# Patient Record
Sex: Male | Born: 1940 | Race: White | Hispanic: No | Marital: Married | Smoking: Current every day smoker
Health system: Southern US, Community
[De-identification: ages and names within clinical notes are randomized; demographics above are authoritative.]

## PROBLEM LIST (undated history)

## (undated) DIAGNOSIS — J302 Other seasonal allergic rhinitis: Secondary | ICD-10-CM

## (undated) DIAGNOSIS — E785 Hyperlipidemia, unspecified: Secondary | ICD-10-CM

## (undated) DIAGNOSIS — J449 Chronic obstructive pulmonary disease, unspecified: Secondary | ICD-10-CM

## (undated) DIAGNOSIS — N400 Enlarged prostate without lower urinary tract symptoms: Secondary | ICD-10-CM

## (undated) DIAGNOSIS — M1990 Unspecified osteoarthritis, unspecified site: Secondary | ICD-10-CM

## (undated) HISTORY — DX: Chronic obstructive pulmonary disease, unspecified: J44.9

## (undated) HISTORY — PX: CHOLECYSTECTOMY: SHX55

## (undated) HISTORY — DX: Benign prostatic hyperplasia without lower urinary tract symptoms: N40.0

## (undated) HISTORY — PX: KNEE ARTHROSCOPY: SUR90

## (undated) HISTORY — DX: Other seasonal allergic rhinitis: J30.2

## (undated) HISTORY — DX: Unspecified osteoarthritis, unspecified site: M19.90

## (undated) HISTORY — DX: Hyperlipidemia, unspecified: E78.5

---

## 2001-06-15 ENCOUNTER — Encounter: Payer: Self-pay | Admitting: Surgery

## 2001-06-19 ENCOUNTER — Encounter: Payer: Self-pay | Admitting: Surgery

## 2001-06-19 ENCOUNTER — Ambulatory Visit (HOSPITAL_COMMUNITY): Admission: RE | Admit: 2001-06-19 | Discharge: 2001-06-20 | Payer: Self-pay | Admitting: Surgery

## 2011-12-27 ENCOUNTER — Encounter: Payer: Self-pay | Admitting: Internal Medicine

## 2011-12-27 ENCOUNTER — Ambulatory Visit (INDEPENDENT_AMBULATORY_CARE_PROVIDER_SITE_OTHER): Payer: Medicare Other | Admitting: Internal Medicine

## 2011-12-27 ENCOUNTER — Ambulatory Visit (INDEPENDENT_AMBULATORY_CARE_PROVIDER_SITE_OTHER)
Admission: RE | Admit: 2011-12-27 | Discharge: 2011-12-27 | Disposition: A | Discharge status: Home / Self Care | Payer: Medicare Other | Source: Ambulatory Visit | Attending: Internal Medicine | Admitting: Internal Medicine

## 2011-12-27 VITALS — BP 134/74 | HR 59 | Temp 97.7°F | Ht 70.0 in | Wt 182.0 lb

## 2011-12-27 DIAGNOSIS — J449 Chronic obstructive pulmonary disease, unspecified: Secondary | ICD-10-CM

## 2011-12-27 DIAGNOSIS — F17200 Nicotine dependence, unspecified, uncomplicated: Secondary | ICD-10-CM

## 2011-12-27 DIAGNOSIS — F17210 Nicotine dependence, cigarettes, uncomplicated: Secondary | ICD-10-CM | POA: Insufficient documentation

## 2011-12-27 MED ORDER — BUDESONIDE-FORMOTEROL FUMARATE 160-4.5 MCG/ACT IN AERO: INHALATION_SPRAY | RESPIRATORY_TRACT | Status: DC

## 2011-12-27 NOTE — Patient Instructions (Addendum)
The key is to stop smoking completely before smoking completely stops you!   Symbicort 160 Take 2 puffs first thing in am and then another 2 puffs about 12 hours later - blow out thru nose   use combivent only as needed  Please remember to go to the lab  department downstairs for your tests - we will call you with the results when they are available.     Please schedule a follow up office visit in 6 weeks, call sooner if needed with PFT's

## 2011-12-27 NOTE — Progress Notes (Signed)
  Subjective:    Patient ID: Ernest Singleton, male    DOB: [birth year]   MRN: [id number]  HPI  71 yowm active smoker no resp problems until around age 71 with nasal congestion then breathing problems onset around age 71 or so and gradually worse so referred 12/27/2011 to pulmonary clinic by Dr Charm Barges   12/27/2011 1st pulmonary ov/ Ilena Dieckman cc doe assoc with congested cough year round for sev years esp with hot humid weather, mucus is white usually occ yellow esp in am and again after supper. On advair x 4-5 year and combivent min improved. Nose is stuffy too.  Still not all that limited from desired activities from doe. No overt hb, no really purulent sputum or h/o hemoptysis or tendency to aecopd  Sleeping ok without nocturnal  or early am exacerbation  of respiratory  c/o's or need for noct saba. Also denies any obvious fluctuation of symptoms with weather or environmental changes or other aggravating or alleviating factors except as outlined above      Review of Systems  Constitutional: Negative for fever, chills, activity change, appetite change and unexpected weight change.  HENT: Positive for congestion and sneezing. Negative for sore throat, rhinorrhea, trouble swallowing, dental problem, voice change and postnasal drip.   Eyes: Negative for visual disturbance.  Respiratory: Positive for cough and shortness of breath. Negative for choking.   Cardiovascular: Negative for chest pain and leg swelling.  Gastrointestinal: Negative for nausea, vomiting and abdominal pain.  Genitourinary: Negative for difficulty urinating.  Musculoskeletal: Positive for arthralgias.  Skin: Negative for rash.  Psychiatric/Behavioral: Negative for behavioral problems and confusion.       Objective:   Physical Exam Wt Readings from Last 3 Encounters:  12/27/11 182 lb (82.555 kg)    Pleasant amb wm nad Edentulous HEENT mild turbinate edema.  Oropharynx no thrush or excess pnd or cobblestoning.  No JVD or  cervical adenopathy. Mild accessory muscle hypertrophy. Trachea midline, nl thryroid. Chest was hyperinflated by percussion with diminished breath sounds and moderate increased exp time without wheeze. Hoover sign positive at mid inspiration. Regular rate and rhythm without murmur gallop or rub or increase P2 or edema.  Abd: no hsm, nl excursion. Ext warm without cyanosis or clubbing.    CXR  12/27/2011 :Hyperinflation configuration consistent with element of COPD. Central peribronchial thickening. This may be associated with bronchitis, asthma, and reactive airway disease. No consolidation or pleural effusion is evident       Assessment & Plan:

## 2011-12-27 NOTE — Assessment & Plan Note (Addendum)
Moderate clinically and characterized more by bronchitis or asthma/ emphysema clinically.  Since cough is really his primary complaint and advair not helping, rec trial of symbicort 160 2bid then return for pft's  The proper method of use, as well as anticipated side effects, of a metered-dose inhaler are discussed and demonstrated to the patient. Improved effectiveness after extensive coaching during this visit to a level of approximately  75%

## 2011-12-27 NOTE — Assessment & Plan Note (Addendum)
>   3 min discussion  I reviewed the Flethcher curve with patient that basically indicates  if you quit smoking when your best day FEV1 is still well preserved (which his appears to be) it is highly unlikely you will progress to severe disease and informed the patient there was no medication on the market that has proven to change the curve or the likelihood of progression.  Therefore stopping smoking and maintaining abstinence is the most important aspect of care, not choice of inhalers or for that matter, doctors.   

## 2011-12-27 NOTE — Progress Notes (Signed)
Quick Note:  Attempt to call patient home and cell numbers, no answer. Unable to leave message on machine. Will ATCB ______

## 2011-12-28 NOTE — Progress Notes (Signed)
Quick Note:  Spoke with pt and notified of results per Dr. Wert. Pt verbalized understanding and denied any questions.  ______ 

## 2012-02-09 ENCOUNTER — Encounter: Payer: Self-pay | Admitting: Internal Medicine

## 2012-02-09 ENCOUNTER — Ambulatory Visit (INDEPENDENT_AMBULATORY_CARE_PROVIDER_SITE_OTHER): Payer: Medicare Other | Admitting: Internal Medicine

## 2012-02-09 VITALS — BP 110/62 | HR 52 | Temp 97.5°F | Ht 72.0 in | Wt 181.0 lb

## 2012-02-09 DIAGNOSIS — J449 Chronic obstructive pulmonary disease, unspecified: Secondary | ICD-10-CM

## 2012-02-09 DIAGNOSIS — F17200 Nicotine dependence, unspecified, uncomplicated: Secondary | ICD-10-CM

## 2012-02-09 DIAGNOSIS — J310 Chronic rhinitis: Secondary | ICD-10-CM

## 2012-02-09 LAB — PULMONARY FUNCTION TEST

## 2012-02-09 MED ORDER — IPRATROPIUM-ALBUTEROL 20-100 MCG/ACT IN AERS: 1.0000 | INHALATION_SPRAY | Freq: Four times a day (QID) | RESPIRATORY_TRACT | Status: DC

## 2012-02-09 MED ORDER — AZELASTINE-FLUTICASONE 137-50 MCG/ACT NA SUSP: 2.0000 | Freq: Two times a day (BID) | NASAL | Status: DC

## 2012-02-09 NOTE — Patient Instructions (Signed)
The key is to stop smoking completely before smoking completely stops you - it's not too late  Try dymista twice daily to see if helps your nasal congestion  When you refill the combivent the dose is one puff every 6 hours if needed   Please schedule a follow up visit in 3 months but call sooner if needed

## 2012-02-09 NOTE — Assessment & Plan Note (Signed)
Features of allergic and vasomotor rhinitis by hx   Trial of dymista 02/09/2012 >>>

## 2012-02-09 NOTE — Progress Notes (Signed)
PFT done today. 

## 2012-02-09 NOTE — Assessment & Plan Note (Signed)

## 2012-02-09 NOTE — Assessment & Plan Note (Signed)
-   HFA 75% 12/27/2011   - PFT's 02/09/2012 FEV1 1.51 (51%) ratio 42  And 21% better p saba, dlco 55%   GOLD II/III symptoms better controlled on symbicort and no exacerbations but still smoking - discussed separately    Each maintenance medication was reviewed in detail including most importantly the difference between maintenance and as needed and under what circumstances the prns are to be used.  Please see instructions for details which were reviewed in writing and the patient given a copy.

## 2012-02-09 NOTE — Progress Notes (Signed)
  Subjective:    Patient ID: Ernest Singleton, male    DOB: [birth year]   MRN: [id number]    Brief patient profile:  4  yowm active smoker no resp problems until around age 71 with nasal congestion then breathing problems onset around age 71 or so and gradually worse so referred 12/27/2011 to pulmonary clinic by Dr Charm Barges with GOLD II/III COPD documented 02/09/2012   HPI 12/27/2011 1st pulmonary ov/ Diar Berkel cc doe assoc with congested cough year round for sev years esp with hot humid weather, mucus is white usually occ yellow esp in am and again after supper. On advair x 4-5 year and combivent min improved. Nose is stuffy too.  Still not all that limited from desired activities from doe. No overt hb, no really purulent sputum or h/o hemoptysis or tendency to aecopd rec The key is to stop smoking completely before smoking completely stops you!   Symbicort 160 Take 2 puffs first thing in am and then another 2 puffs about 12 hours later - blow out thru nose  use combivent only as needed  02/09/2012 f/u ov/Dyana Magner still smoking  cc better on symbicort 160 than advair, combivent once a day around lunch if hot and humid or mowing.  No unusual cough, purulent sputum or sinus/hb symptoms on present rx.    Sleeping ok without nocturnal  or early am exacerbation  of respiratory  c/o's or need for noct saba. Also denies any obvious fluctuation of symptoms with weather or environmental changes or other aggravating or alleviating factors except as outlined above    ROS  The following are not active complaints unless bolded sore throat, dysphagia, dental problems, itching, sneezing,  nasal congestion or excess/ purulent secretions, ear ache,   fever, chills, sweats, unintended wt loss, pleuritic or exertional cp, hemoptysis,  orthopnea pnd or leg swelling, presyncope, palpitations, heartburn, abdominal pain, anorexia, nausea, vomiting, diarrhea  or change in bowel or urinary habits, change in stools or urine,  dysuria,hematuria,  rash, arthralgias, visual complaints, headache, numbness weakness or ataxia or problems with walking or coordination,  change in mood/affect or memory.              Objective:   Physical Exam  Wt Readings from Last 3 Encounters:  02/09/12 181 lb (82.101 kg)  12/27/11 182 lb (82.555 kg)     Pleasant amb wm nad Edentulous HEENT mild turbinate edema.  Oropharynx no thrush or excess pnd or cobblestoning.  No JVD or cervical adenopathy. Mild accessory muscle hypertrophy. Trachea midline, nl thryroid. Chest was hyperinflated by percussion with diminished breath sounds and moderate increased exp time without wheeze. Hoover sign positive at mid inspiration. Regular rate and rhythm without murmur gallop or rub or increase P2 or edema.  Abd: no hsm, nl excursion. Ext warm without cyanosis or clubbing.    CXR  12/27/2011 :Hyperinflation configuration consistent with element of COPD. Central peribronchial thickening. This may be associated with bronchitis, asthma, and reactive airway disease. No consolidation or pleural effusion is evident       Assessment & Plan:

## 2012-06-19 ENCOUNTER — Ambulatory Visit: Payer: Medicare Other | Admitting: Internal Medicine

## 2012-09-24 ENCOUNTER — Encounter: Payer: Self-pay | Admitting: Cardiology

## 2012-09-26 ENCOUNTER — Ambulatory Visit (INDEPENDENT_AMBULATORY_CARE_PROVIDER_SITE_OTHER): Payer: Medicare Other | Admitting: Internal Medicine

## 2012-09-26 ENCOUNTER — Encounter: Payer: Self-pay | Admitting: Internal Medicine

## 2012-09-26 VITALS — BP 120/82 | HR 77 | Temp 97.2°F | Ht 69.5 in | Wt 182.0 lb

## 2012-09-26 DIAGNOSIS — F17200 Nicotine dependence, unspecified, uncomplicated: Secondary | ICD-10-CM

## 2012-09-26 DIAGNOSIS — J449 Chronic obstructive pulmonary disease, unspecified: Secondary | ICD-10-CM

## 2012-09-26 MED ORDER — IPRATROPIUM-ALBUTEROL 20-100 MCG/ACT IN AERS: 1.0000 | INHALATION_SPRAY | Freq: Four times a day (QID) | RESPIRATORY_TRACT | Status: DC

## 2012-09-26 MED ORDER — BUDESONIDE-FORMOTEROL FUMARATE 160-4.5 MCG/ACT IN AERO: INHALATION_SPRAY | RESPIRATORY_TRACT | Status: DC

## 2012-09-26 MED ORDER — AZELASTINE-FLUTICASONE 137-50 MCG/ACT NA SUSP: 2.0000 | Freq: Two times a day (BID) | NASAL | Status: DC

## 2012-09-26 NOTE — Assessment & Plan Note (Addendum)
-   HFA 90% 09/26/2012   - PFT's 02/09/2012 FEV1 1.51 (51%) ratio 42  And 21% better p saba, dlco 55%  DDX of  difficult airways managment all start with A and  include Adherence, Ace Inhibitors, Acid Reflux, Active Sinus Disease, Alpha 1 Antitripsin deficiency, Anxiety masquerading as Airways dz,  ABPA,  allergy(esp in young), Aspiration (esp in elderly), Adverse effects of DPI,  Active smokers, plus two Bs  = Bronchiectasis and Beta blocker use..and one C= CHF   Adherence is always the initial "prime suspect" and is a multilayered concern that requires a "trust but verify" approach in every patient - starting with knowing how to use medications, especially inhalers, correctly, keeping up with refills and understanding the fundamental difference between maintenance and prns vs those medications only taken for a very short course and then stopped and not refilled. The proper method of use, as well as anticipated side effects, of a metered-dose inhaler are discussed and demonstrated to the patient. Improved effectiveness after extensive coaching during this visit to a level of approximately  90% so needs to use the symbicort and combivent as precribed and if still not doing well change to tudorza next ov  Active smoking > discussed separately.

## 2012-09-26 NOTE — Progress Notes (Signed)
Subjective:    Patient ID: Ernest Singleton, male    DOB: 1941/02/17   MRN: [id number]    Brief patient profile:  72  yowm active smoker no resp problems until around age 72 with nasal congestion then breathing problems onset around age 1 or so and gradually worse so referred 12/27/2011 to pulmonary clinic by Dr Charm Barges with GOLD II/III COPD documented 02/09/2012   HPI 12/27/2011 1st pulmonary ov/ Ernest Singleton cc doe assoc with congested cough year round for sev years esp with hot humid weather, mucus is white usually occ yellow esp in am and again after supper. On advair x 4-5 year and combivent min improved. Nose is stuffy too.  Still not all that limited from desired activities from doe. No overt hb, no really purulent sputum or h/o hemoptysis or tendency to aecopd rec The key is to stop smoking completely before smoking completely stops you!  Symbicort 160 Take 2 puffs first thing in am and then another 2 puffs about 12 hours later - blow out thru nose use combivent only as needed  02/09/2012 f/u ov/Ernest Singleton still smoking  cc better on symbicort 160 than advair, combivent once a day around lunch if hot and humid or mowing.  rec The key is to stop smoking completely before smoking completely stops you - it's not too late Try dymista twice daily to see if helps your nasal congestion> helped When you refill the combivent the dose is one puff every 6 hours if needed   09/26/2012 f/u ov/Ernest Singleton re copd still smoking Chief Complaint  Patient presents with  . Follow-up    Breathing is slightly worse since Spring season started, relates to pollen in the air. He has occ cough with minimal yellow sputum.    not using symbicort first thing in am as rec, not using much combivent much at all, does feel dysmista helping nasal symptoms vs last spring.   Has trouble "getting his motor running" early each am" s other obvious daytime variabilty or assoc  cp or chest tightness, subjective wheeze overt sinus or hb symptoms.  No unusual exp hx or h/o childhood pna/ asthma or premature birth to his knowledge.    Sleeping ok without nocturnal  or early am exacerbation  of respiratory  c/o's or need for noct saba. Also denies any obvious fluctuation of symptoms with weather or environmental changes or other aggravating or alleviating factors except as outlined above    ROS  The following are not active complaints unless bolded sore throat, dysphagia, dental problems, itching, sneezing,  nasal congestion or excess/ purulent secretions, ear ache,   fever, chills, sweats, unintended wt loss, pleuritic or exertional cp, hemoptysis,  orthopnea pnd or leg swelling, presyncope, palpitations, heartburn, abdominal pain, anorexia, nausea, vomiting, diarrhea  or change in bowel or urinary habits, change in stools or urine, dysuria,hematuria,  rash, arthralgias, visual complaints, headache, numbness weakness or ataxia or problems with walking or coordination,  change in mood/affect or memory.              Objective:   Physical Exam 09/26/2012  182  Wt Readings from Last 3 Encounters:  02/09/12 181 lb (82.101 kg)  12/27/11 182 lb (82.555 kg)     Pleasant amb wm nad Edentulous HEENT mild turbinate edema.  Oropharynx no thrush or excess pnd or cobblestoning.  No JVD or cervical adenopathy. Mild accessory muscle hypertrophy. Trachea midline, nl thryroid. Chest was hyperinflated by percussion with diminished breath sounds and moderate increased exp  time without wheeze. Hoover sign positive at mid inspiration. Regular rate and rhythm without murmur gallop or rub or increase P2 or edema.  Abd: no hsm, nl excursion. Ext warm without cyanosis or clubbing.    CXR  12/27/2011 :Hyperinflation configuration consistent with element of COPD. Central peribronchial thickening. This may be associated with bronchitis, asthma, and reactive airway disease. No consolidation or pleural effusion is evident       Assessment & Plan:

## 2012-09-26 NOTE — Patient Instructions (Addendum)
Symbicort 160 Take 2 puffs first thing in am and then another 2 puffs about 12 hours later.   The key is to stop smoking completely before smoking completely stops you - it's not too late  Try dymista twice daily to see if helps your nasal congestion  When you refill the combivent the dose is one puff every 6 hours if needed   Please schedule a follow up visit in 6 months but call sooner if needed  Late add consider LAMA next ov

## 2012-09-28 ENCOUNTER — Encounter: Payer: Self-pay | Admitting: Cardiology

## 2012-09-28 NOTE — Assessment & Plan Note (Signed)

## 2012-09-29 ENCOUNTER — Other Ambulatory Visit: Payer: Self-pay | Admitting: *Deleted

## 2012-09-29 ENCOUNTER — Ambulatory Visit (INDEPENDENT_AMBULATORY_CARE_PROVIDER_SITE_OTHER): Payer: Medicare Other | Admitting: Cardiology

## 2012-09-29 ENCOUNTER — Encounter: Payer: Self-pay | Admitting: Cardiology

## 2012-09-29 VITALS — BP 142/81 | HR 80 | Ht 69.0 in | Wt 183.4 lb

## 2012-09-29 DIAGNOSIS — Z136 Encounter for screening for cardiovascular disorders: Secondary | ICD-10-CM

## 2012-09-29 DIAGNOSIS — R55 Syncope and collapse: Secondary | ICD-10-CM

## 2012-09-29 DIAGNOSIS — J449 Chronic obstructive pulmonary disease, unspecified: Secondary | ICD-10-CM

## 2012-09-29 MED ORDER — MEDICAL COMPRESSION STOCKINGS MISC: 1.0000 | Status: DC

## 2012-09-29 NOTE — Patient Instructions (Addendum)
Your physician recommends that you schedule a follow-up appointment in: 1 month. Your physician recommends that you continue on your current medications as directed. Please refer to the Current Medication list given to you today. Your physician has recommended that you wear an event monitor. Event monitors are medical devices that record the heart's electrical activity. Doctors most often [place] these monitors to diagnose arrhythmias. Arrhythmias are problems with the speed or rhythm of the heartbeat. The monitor is a small, portable device. You can wear one while you do your normal daily activities. This is usually used to diagnose what is causing palpitations/syncope (passing out). You have been given a prescription for compression hose. Please take this prescription to [facility] or your pharmacy of choice for proper measuring and fitting.

## 2012-09-29 NOTE — Assessment & Plan Note (Signed)
Followed by Dr Wert 

## 2012-09-29 NOTE — Assessment & Plan Note (Signed)
Occurred after a prolonged period of standing, possibly neurocardiogenic in etiology. He is on Flomax which can contribute to orthostatic hypotension, although has been on this medication for a long time. Baseline blood pressure today is elevated, no diagnostic orthostatic change. He reports occasional feelings of lightheadedness, leg tingling, also occasional palpitations. 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 Will also screen with a seven-day monitor to exclude any associated arrhythmias. We'll bring him back in a month to see how he is doing.

## 2012-09-29 NOTE — Progress Notes (Signed)
Clinical Summary Ernest Singleton is a 72 y.o.male referred for cardiology consultation by Dr. Charm Barges. He reports an episode of syncope that occurred after he had been standing for approximately 2 hours during a funeral service. He states that he felt somewhat "funny" prior to this happening, no distinct palpitations, breathlessness, or chest pain. He was attended to and told his blood pressure was "low," also checked by EMS but did not require any specific treatment or further intervention. Since that time he has had no further events. He does state that he occasionally feels somewhat lightheaded, no specific precipitant. Sometimes states he feels like his heart is "fast."  He reports no history of CAD, has had no angina. He does have stable NYHA class II dyspnea with COPD. No history of cardiac arrhythmias or structural heart disease.  Medications reviewed, he states that Flomax is a long-term medication.  ECG today shows sinus rhythm with sinus arrhythmia and motion artifact.  Orthostatics checked today in the office showed decrease in systolic blood pressure from 153 down to 146 from supine to seated position, increased to 150 with standing, but decreased down to 137 with persistent standing. Heart rate did not change significantly.   Allergies  Allergen Reactions  . Requip (Ropinirole Hcl)     Dizziness     Current Outpatient Prescriptions  Medication Sig Dispense Refill  . aspirin 81 MG tablet Take 81 mg by mouth daily.      . Azelastine-Fluticasone (DYMISTA) 137-50 MCG/ACT SUSP Place 2 puffs into the nose 2 (two) times daily.  1 Bottle  11  . budesonide-formoterol (SYMBICORT) 160-4.5 MCG/ACT inhaler Take 2 puffs first thing in am and then another 2 puffs about 12 hours later.  1 Inhaler  12  . CELEBREX 200 MG capsule Take 1 tablet by mouth daily.      . cetirizine (ZYRTEC) 10 MG chewable tablet Chew 10 mg by mouth daily.       . Cholecalciferol (VITAMIN D-3) 1000 UNITS CAPS Take 1,000  Units by mouth daily.      Ernest Singleton (MEDICAL COMPRESSION STOCKINGS) MISC 1 each by Does not apply route as directed.  1 each  0  . Ipratropium-Albuterol (COMBIVENT RESPIMAT) 20-100 MCG/ACT AERS respimat Inhale 1 puff into the lungs every 6 (six) hours.  1 Inhaler  11  . levofloxacin (LEVAQUIN) 500 MG tablet Take 1 tablet by mouth 2 (two) times daily.      . Tamsulosin HCl (FLOMAX) 0.4 MG CAPS Take 0.4 mg by mouth daily.       No current facility-administered medications for this visit.    Past Medical History  Diagnosis Date  . Seasonal allergies   . COPD (chronic obstructive pulmonary disease)     Dr. Sherene Sires  . Osteoarthritis   . BPH (benign prostatic hyperplasia)   . Hyperlipidemia     Past Surgical History  Procedure Laterality Date  . Cholecystectomy    . Knee arthroscopy      Family History  Problem Relation Age of Onset  . Lung cancer Paternal Uncle     ? if smoker or not  . Brain cancer Paternal Uncle   . Heart disease Father     Social History Ernest Singleton reports that he has been smoking Cigarettes.  He has a 54 pack-year smoking history. He has never used smokeless tobacco. Ernest Singleton reports that he does not drink alcohol.  Review of Systems No exertional chest pain. No orthopnea or PND. No  claudication. Occasional tingling in his legs when he stands. Stable appetite. No bleeding problems. Otherwise negative. Physical Examination Filed Vitals:   09/29/12 0851  BP: 142/81  Pulse: 80   Filed Weights   09/29/12 0841  Weight: 183 lb 6.4 oz (83.19 kg)   No acute distress. HEENT: Conjunctiva and lids normal, oropharynx clear. Neck: Supple, no elevated JVP or carotid bruits, no thyromegaly. Lungs: Clear to auscultation, nonlabored breathing at rest. Cardiac: Regular rate and rhythm, no S3 or significant systolic murmur, no pericardial rub. Abdomen: Soft, nontender, bowel sounds present, no bruits. Extremities: No pitting edema, distal pulses  2+. Skin: Warm and dry. Musculoskeletal: No kyphosis. Neuropsychiatric: Alert and oriented x3, affect grossly appropriate.   Problem List and Plan   Syncope Occurred after a prolonged period of standing, possibly neurocardiogenic in etiology. He is on Flomax which can contribute to orthostatic hypotension, although has been on this medication for a long time. Baseline blood pressure today is elevated, no diagnostic orthostatic change. He reports occasional feelings of lightheadedness, leg tingling, also occasional palpitations. 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 Will also screen with a seven-day monitor to exclude any associated arrhythmias. We'll bring him back in a month to see how he is doing.  COPD (chronic obstructive pulmonary disease) Followed by Dr. Sherene Sires.    Jonelle Sidle, M.D., F.A.C.C.

## 2012-11-07 ENCOUNTER — Encounter: Payer: Self-pay | Admitting: Cardiology

## 2012-11-07 ENCOUNTER — Ambulatory Visit (INDEPENDENT_AMBULATORY_CARE_PROVIDER_SITE_OTHER): Payer: Medicare Other | Admitting: Cardiology

## 2012-11-07 VITALS — BP 124/72 | HR 75 | Ht 70.0 in | Wt 180.8 lb

## 2012-11-07 DIAGNOSIS — R55 Syncope and collapse: Secondary | ICD-10-CM

## 2012-11-07 NOTE — Progress Notes (Signed)
   Clinical Summary Mr. Earna Coder SR is a 72 y.o.male last seen in April 2014. He presents for followup today, reports no dizziness or syncope. Does have stable fatigue and shortness of breath (particularly in the heat), no history of COPD. No exertional chest tightness or heaviness however.  Cardiac monitor reviewed finding sinus rhythm with artifact, no sustained arrhythmias or pauses. We reviewed this today.  For now would recommend symptom observation. No further testing at this particular time.  Allergies  Allergen Reactions  . Requip (Ropinirole Hcl)     Dizziness     Current Outpatient Prescriptions  Medication Sig Dispense Refill  . aspirin 81 MG tablet Take 81 mg by mouth daily.      . Azelastine-Fluticasone (DYMISTA) 137-50 MCG/ACT SUSP Place 2 puffs into the nose 2 (two) times daily.  1 Bottle  11  . budesonide-formoterol (SYMBICORT) 160-4.5 MCG/ACT inhaler Take 2 puffs first thing in am and then another 2 puffs about 12 hours later.  1 Inhaler  12  . CELEBREX 200 MG capsule Take 1 tablet by mouth daily.      . Cholecalciferol (VITAMIN D-3) 1000 UNITS CAPS Take 1,000 Units by mouth daily.      . Ipratropium-Albuterol (COMBIVENT) 20-100 MCG/ACT AERS respimat Inhale 1 puff into the lungs every 6 (six) hours as needed.      . Tamsulosin HCl (FLOMAX) 0.4 MG CAPS Take 0.4 mg by mouth daily.      . cetirizine (ZYRTEC) 10 MG chewable tablet Chew 10 mg by mouth as needed.        No current facility-administered medications for this visit.    Past Medical History  Diagnosis Date  . Seasonal allergies   . COPD (chronic obstructive pulmonary disease)     Dr. Sherene Sires  . Osteoarthritis   . BPH (benign prostatic hyperplasia)   . Hyperlipidemia     Social History Mr. Earna Coder SR reports that he has been smoking Cigarettes.  He has a 54 pack-year smoking history. He has never used smokeless tobacco. Mr. Earna Coder SR reports that he does not drink alcohol.  Review of Systems Negative  except as outlined.  Physical Examination Filed Vitals:   11/07/12 1358  BP: 124/72  Pulse: 75   Filed Weights   11/07/12 1358  Weight: 180 lb 12.8 oz (82.01 kg)    No acute distress.  HEENT: Conjunctiva and lids normal, oropharynx clear.  Neck: Supple, no elevated JVP or carotid bruits, no thyromegaly.  Lungs: Clear to auscultation, nonlabored breathing at rest.  Cardiac: Regular rate and rhythm, no S3 or significant systolic murmur, no pericardial rub.  Abdomen: Soft, nontender, bowel sounds present, no bruits.  Extremities: No pitting edema, distal pulses 2+.    Problem List and Plan   Syncope No recurrences, most likely neurocardiogenic based on description of previous event. Cardiac monitor was unrevealing. Observation recommended for now. We have scheduled a followup visit.    Jonelle Sidle, M.D., F.A.C.C.

## 2012-11-07 NOTE — Patient Instructions (Addendum)

## 2012-11-07 NOTE — Assessment & Plan Note (Signed)
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 Cardiac monitor was unrevealing. Observation recommended for now. We have scheduled a followup visit.

## 2013-01-26 ENCOUNTER — Other Ambulatory Visit: Payer: Self-pay | Admitting: *Deleted

## 2013-01-26 MED ORDER — BUDESONIDE-FORMOTEROL FUMARATE 160-4.5 MCG/ACT IN AERO: INHALATION_SPRAY | RESPIRATORY_TRACT | Status: DC

## 2013-03-26 ENCOUNTER — Ambulatory Visit (INDEPENDENT_AMBULATORY_CARE_PROVIDER_SITE_OTHER)
Admission: RE | Admit: 2013-03-26 | Discharge: 2013-03-26 | Disposition: A | Discharge status: Home / Self Care | Payer: Medicare Other | Source: Ambulatory Visit | Attending: Internal Medicine | Admitting: Internal Medicine

## 2013-03-26 ENCOUNTER — Ambulatory Visit (INDEPENDENT_AMBULATORY_CARE_PROVIDER_SITE_OTHER): Payer: Medicare Other | Admitting: Internal Medicine

## 2013-03-26 ENCOUNTER — Encounter: Payer: Self-pay | Admitting: Internal Medicine

## 2013-03-26 VITALS — BP 130/82 | HR 52 | Temp 97.5°F | Ht 69.5 in | Wt 179.6 lb

## 2013-03-26 DIAGNOSIS — J449 Chronic obstructive pulmonary disease, unspecified: Secondary | ICD-10-CM

## 2013-03-26 DIAGNOSIS — Z23 Encounter for immunization: Secondary | ICD-10-CM

## 2013-03-26 DIAGNOSIS — F17200 Nicotine dependence, unspecified, uncomplicated: Secondary | ICD-10-CM

## 2013-03-26 MED ORDER — AMOXICILLIN-POT CLAVULANATE 875-125 MG PO TABS: 1.0000 | ORAL_TABLET | Freq: Two times a day (BID) | ORAL | Status: DC

## 2013-03-26 MED ORDER — ALBUTEROL SULFATE HFA 108 (90 BASE) MCG/ACT IN AERS: 2.0000 | INHALATION_SPRAY | Freq: Four times a day (QID) | RESPIRATORY_TRACT | Status: DC | PRN

## 2013-03-26 MED ORDER — BUDESONIDE-FORMOTEROL FUMARATE 160-4.5 MCG/ACT IN AERO: INHALATION_SPRAY | RESPIRATORY_TRACT | Status: DC

## 2013-03-26 NOTE — Addendum Note (Signed)
Addended by: Gwynneth Aliment A on: 03/26/2013 04:33 PM   Modules accepted: Orders

## 2013-03-26 NOTE — Assessment & Plan Note (Signed)
-   HFA 90% 09/26/2012   - PFT's 02/09/2012 FEV1 1.51 (51%) ratio 42  And 21% better p saba, dlco 55%   Mild flare of CB x 1 month likely due to smoking/ rhinitis > rx augmentin and recheck cxr as still smoking  Smoking discussed separately  The proper method of use, as well as anticipated side effects, of a metered-dose inhaler are discussed and demonstrated to the patient. Improved effectiveness after extensive coaching during this visit to a level of approximately  90%     Each maintenance medication was reviewed in detail including most importantly the difference between maintenance and as needed and under what circumstances the prns are to be used.  Please see instructions for details which were reviewed in writing and the patient given a copy.

## 2013-03-26 NOTE — Patient Instructions (Addendum)
Augmentin 875 mg take one pill twice daily  X 10 days - take at breakfast and supper with large glass of water.  It would help reduce the usual side effects (diarrhea and yeast infections) if you ate cultured yogurt at lunch.    Symbicort Take 2 puffs first thing in am and then another 2 puffs about 12 hours later.   For cough best choice is mucinex dm up to 1200 mg every 12 hours  as needed  The key is to stop smoking completely before smoking completely stops you - this is the most important aspect of your care.    Only use your albuterol(proaire)  as a rescue medication to be used if you can't catch your breath by resting or doing a relaxed purse lip breathing pattern.  - The less you use it, the better it will work when you need it. - Ok to use up to every 4 hours if you must but call for immediate appointment if use goes up over your usual need - Don't leave home without it !!  (think of it like your spare tire for your car)   Please remember to go to the  x-ray department downstairs for your tests - we will call you with the results when they are available.  Please schedule a follow up visit in 6 months but call sooner if needed

## 2013-03-26 NOTE — Progress Notes (Signed)
Subjective:    Patient ID: Ernest Singleton, male    DOB: [birth year]   MRN: [id number]    Brief patient profile:  72  yowm active smoker no resp problems until around age 72 with nasal congestion then breathing problems onset around age 1 or so and gradually worse so referred 12/27/2011 to pulmonary clinic by Dr Charm Barges with GOLD II/III COPD documented 02/09/2012   HPI 12/27/2011 1st pulmonary ov/ Ernest Singleton cc doe assoc with congested cough year round for sev years esp with hot humid weather, mucus is white usually occ yellow esp in am and again after supper. On advair x 4-5 year and combivent min improved. Nose is stuffy too.  Still not all that limited from desired activities from doe. No overt hb, no really purulent sputum or h/o hemoptysis or tendency to aecopd rec The key is to stop smoking completely before smoking completely stops you!  Symbicort 160 Take 2 puffs first thing in am and then another 2 puffs about 12 hours later - blow out thru nose use combivent only as needed  02/09/2012 f/u ov/Ernest Singleton still smoking  cc better on symbicort 160 than advair, combivent once a day around lunch if hot and humid or mowing.  rec The key is to stop smoking completely before smoking completely stops you - it's not too late Try dymista twice daily to see if helps your nasal congestion> helped When you refill the combivent the dose is one puff every 6 hours if needed   03/26/2013 f/u ov/Ernest Singleton re: GOLD II copd/ still smoking  Chief Complaint  Patient presents with  . Follow-up    Breathing is unchanged since last OV.  Having congestion, cough w/ mucus yellowish in color x1 month   saba x done daily "maybe around lunchtime"  or if gets in a real hurry at work, seems to help exert better Cough worse first thing in am p stirring then late in pm Using his symbicort in am first thing but second dose is at hs Has empty proaire, not able to use combivent respimat    Still Has trouble "getting his motor running"  early each am" s other obvious daytime variabilty or assoc  cp or chest tightness, subjective wheeze overt sinus or hb symptoms. No unusual exp hx or h/o childhood pna/ asthma or premature birth to his knowledge.       Sleeping ok without nocturnal  or early am exacerbation  of respiratory  c/o's or need for noct saba. Also denies any obvious fluctuation of symptoms with weather or environmental changes or other aggravating or alleviating factors except as outlined above   Current Medications, Allergies, Complete Past Medical History, Past Surgical History, Family History, and Social History were reviewed in [employer] record.  ROS  The following are not active complaints unless bolded sore throat, dysphagia, dental problems, itching, sneezing,  nasal congestion or excess/ purulent secretions, ear ache,   fever, chills, sweats, unintended wt loss, pleuritic or exertional cp, hemoptysis,  orthopnea pnd or leg swelling, presyncope, palpitations, heartburn, abdominal pain, anorexia, nausea, vomiting, diarrhea  or change in bowel or urinary habits, change in stools or urine, dysuria,hematuria,  rash, arthralgias, visual complaints, headache, numbness weakness or ataxia or problems with walking or coordination,  change in mood/affect or memory.            Objective:   Physical Exam 09/26/2012  182 >  03/26/2013  179  Wt Readings from Last 3 Encounters:  02/09/12  181 lb (82.101 kg)  12/27/11 182 lb (82.555 kg)     Pleasant amb wm nad Edentulous HEENT mild turbinate edema.  Oropharynx no thrush or excess pnd or cobblestoning.  No JVD or cervical adenopathy. Mild accessory muscle hypertrophy. Trachea midline, nl thryroid. Chest was hyperinflated by percussion with diminished breath sounds and moderate increased exp time without wheeze. Hoover sign positive at mid inspiration. Regular rate and rhythm without murmur gallop or rub or increase P2 or edema.  Abd: no hsm, nl  excursion. Ext warm without cyanosis or clubbing.       CXR  03/26/2013   Findings consistent with COPD. No acute abnormality seen.         Assessment & Plan:

## 2013-03-26 NOTE — Assessment & Plan Note (Signed)
>   3 min  I reviewed the Flethcher curve with patient that basically indicates  if you quit smoking when your best day FEV1 is still well preserved (as is the case here)  it is highly unlikely you will progress to severe disease and informed the patient there was no medication on the market that has proven to change the curve or the likelihood of progression.  Therefore stopping smoking and maintaining abstinence are  the most important aspects of care, not choice of inhalers or for that matter, doctors.

## 2013-03-27 ENCOUNTER — Telehealth: Payer: Self-pay | Admitting: Internal Medicine

## 2013-03-27 NOTE — Telephone Encounter (Signed)
Advised pt of CXR results per MW.  Pt verbalized understanding and has no further questions at this time

## 2013-08-24 ENCOUNTER — Other Ambulatory Visit: Payer: Self-pay | Admitting: Internal Medicine

## 2013-11-05 ENCOUNTER — Other Ambulatory Visit: Payer: Self-pay | Admitting: Internal Medicine

## 2013-12-04 ENCOUNTER — Other Ambulatory Visit: Payer: Self-pay | Admitting: Internal Medicine

## 2013-12-17 ENCOUNTER — Ambulatory Visit (INDEPENDENT_AMBULATORY_CARE_PROVIDER_SITE_OTHER): Payer: Medicare Other | Admitting: Internal Medicine

## 2013-12-17 ENCOUNTER — Encounter: Payer: Self-pay | Admitting: Internal Medicine

## 2013-12-17 ENCOUNTER — Encounter (INDEPENDENT_AMBULATORY_CARE_PROVIDER_SITE_OTHER): Payer: Self-pay

## 2013-12-17 VITALS — BP 120/64 | HR 56 | Temp 97.6°F | Ht 70.0 in | Wt 176.0 lb

## 2013-12-17 DIAGNOSIS — J449 Chronic obstructive pulmonary disease, unspecified: Secondary | ICD-10-CM

## 2013-12-17 DIAGNOSIS — F17200 Nicotine dependence, unspecified, uncomplicated: Secondary | ICD-10-CM

## 2013-12-17 NOTE — Assessment & Plan Note (Signed)
I took an extended  opportunity with this patient to outline the consequences of continued cigarette use  in airway disorders based on all the data we have from the multiple national lung health studies (perfomed over decades at millions of dollars in cost)  indicating that smoking cessation, not choice of inhalers or physicians, is the most important aspect of care.   

## 2013-12-17 NOTE — Assessment & Plan Note (Signed)
-   PFT's 02/09/2012 FEV1 1.51 (51%) ratio 42  And 21% better p saba, dlco 55%  DDX of  difficult airways management all start with A and  include Adherence, Ace Inhibitors, Acid Reflux, Active Sinus Disease, Alpha 1 Antitripsin deficiency, Anxiety masquerading as Airways dz,  ABPA,  allergy(esp in young), Aspiration (esp in elderly), Adverse effects of DPI,  Active smokers, plus two Bs  = Bronchiectasis and Beta blocker use..and one C= CHF  Adherence is always the initial "prime suspect" and is a multilayered concern that requires a "trust but verify" approach in every patient - starting with knowing how to use medications, especially inhalers, correctly, keeping up with refills and understanding the fundamental difference between maintenance and prns vs those medications only taken for a very short course and then stopped and not refilled.  The proper method of use, as well as anticipated side effects, of a metered-dose inhaler are discussed and demonstrated to the patient. Improved effectiveness after extensive coaching during this visit to a level of approximately  90%  Active smoking discussed seprately, obviously the biggest concern

## 2013-12-17 NOTE — Progress Notes (Signed)
Subjective:    Patient ID: Ernest Singleton, male    DOB: 1941-06-07   MRN: [id number]    Brief patient profile:  52  yowm active smoker no resp problems until around age 73 with nasal congestion then breathing problems onset around age 48 or so and gradually worse so referred 12/27/2011 to pulmonary clinic by Dr Melina Copa with GOLD II/III COPD documented 02/09/2012   HPI 12/27/2011 1st pulmonary ov/ Ernest Singleton cc doe [facility]oc with congested cough year round for sev years esp with hot humid weather, mucus is white usually occ yellow esp in am and again after supper. On advair x 4-5 year and combivent min improved. Nose is stuffy too.  Still not all that limited from desired activities from doe. No overt hb, no really purulent sputum or h/o hemoptysis or tendency to aecopd rec The key is to stop smoking completely before smoking completely stops you!  Symbicort 160 Take 2 puffs first thing in am and then another 2 puffs about 12 hours later - blow out thru nose use combivent only as needed  02/09/2012 f/u ov/Ernest Singleton still smoking  cc better on symbicort 160 than advair, combivent once a day around lunch if hot and humid or mowing.  rec The key is to stop smoking completely before smoking completely stops you - it's not too late Try dymista twice daily to see if helps your nasal congestion> helped When you refill the combivent the dose is one puff every 6 hours if needed   03/26/2013 f/u ov/Ernest Singleton re: GOLD II copd/ still smoking  Chief Complaint  Patient presents with  . Follow-up    Breathing is unchanged since last OV.  Having congestion, cough w/ mucus yellowish in color x1 month   saba x done daily "maybe around lunchtime"  or if gets in a real hurry at work, seems to help exert better Cough worse first thing in am p stirring then late in pm Using his symbicort in am first thing but second dose is at hs Has empty proaire, not able to use combivent respimat rec Augmentin 875 mg take one pill twice daily  X  10 days - take at breakfast and supper with large glass of water.  It would help reduce the usual side effects (diarrhea and yeast infections) if you ate cultured yogurt at lunch.   Symbicort Take 2 puffs first thing in am and then another 2 puffs about 12 hours later.  For cough best choice is mucinex dm up to 1200 mg every 12 hours  as needed The key is to stop smoking completely before smoking completely stops you - this is the most important aspect of your care. Only use your albuterol(proaire) every 4 hours if needed   12/17/2013 f/u ov/Ernest Singleton re: copd GOLD II/ still smoking  Chief Complaint  Patient presents with  . Follow-up    Pt states his breathing seems to be slightly worse for the past several months. Humid weather makes breathing worse. He is using rescue inhaler once per day around noon usually.    Worse in heat- ok walking flat  Some congestion with thick white mucus esp in am, not using saba avg saba maybe once daily at most, never at night or first thing in am    No obvious day to day or daytime variabilty or assoc  cp or chest tightness, subjective wheeze overt sinus or hb symptoms. No unusual exp hx or h/o childhood pna/ asthma or knowledge of premature birth.  Sleeping ok without nocturnal  or early am exacerbation  of respiratory  c/o's or need for noct saba. Also denies any obvious fluctuation of symptoms with weather or environmental changes or other aggravating or alleviating factors except as outlined above   Current Medications, Allergies, Complete Past Medical History, Past Surgical History, Family History, and Social History were reviewed in [employer] record.  ROS  The following are not active complaints unless bolded sore throat, dysphagia, dental problems, itching, sneezing,  nasal congestion or excess/ purulent secretions, ear ache,   fever, chills, sweats, unintended wt loss, pleuritic or exertional cp, hemoptysis,  orthopnea pnd or leg  swelling, presyncope, palpitations, heartburn, abdominal pain, anorexia, nausea, vomiting, diarrhea  or change in bowel or urinary habits, change in stools or urine, dysuria,hematuria,  rash, arthralgias, visual complaints, headache, numbness weakness or ataxia or problems with walking or coordination,  change in mood/affect or memory.                      Objective:   Physical Exam 09/26/2012  182 >  03/26/2013  179 >  12/17/2013   176  Wt Readings from Last 3 Encounters:  02/09/12 181 lb (82.101 kg)  12/27/11 182 lb (82.555 kg)     Pleasant amb wm nad Edentulous/ congested rattling cough on FVC maneuver HEENT mild turbinate edema.  Oropharynx no thrush or excess pnd or cobblestoning.  No JVD or cervical adenopathy. Mild accessory muscle hypertrophy. Trachea midline, nl thryroid. Chest was hyperinflated by percussion with diminished breath sounds and moderate increased exp time without wheeze. Hoover sign positive at mid inspiration. Regular rate and rhythm without murmur gallop or rub or increase P2 or edema.  Abd: no hsm, nl excursion. Ext warm without cyanosis or clubbing.       CXR  03/26/2013   Findings consistent with COPD. No acute abnormality seen.         Assessment & Plan:

## 2013-12-17 NOTE — Patient Instructions (Addendum)
Please schedule a follow up visit in 3 months but call sooner if needed with cxr and pfts

## 2014-01-28 ENCOUNTER — Ambulatory Visit (INDEPENDENT_AMBULATORY_CARE_PROVIDER_SITE_OTHER): Payer: Medicare Other | Admitting: Internal Medicine

## 2014-01-28 ENCOUNTER — Ambulatory Visit (INDEPENDENT_AMBULATORY_CARE_PROVIDER_SITE_OTHER)
Admission: RE | Admit: 2014-01-28 | Discharge: 2014-01-28 | Disposition: A | Discharge status: Home / Self Care | Payer: Medicare Other | Source: Ambulatory Visit | Attending: Internal Medicine | Admitting: Internal Medicine

## 2014-01-28 ENCOUNTER — Encounter: Payer: Self-pay | Admitting: Internal Medicine

## 2014-01-28 VITALS — BP 132/70 | HR 55 | Temp 97.5°F | Ht 70.0 in | Wt 177.0 lb

## 2014-01-28 DIAGNOSIS — R918 Other nonspecific abnormal finding of lung field: Secondary | ICD-10-CM | POA: Insufficient documentation

## 2014-01-28 DIAGNOSIS — J449 Chronic obstructive pulmonary disease, unspecified: Secondary | ICD-10-CM

## 2014-01-28 DIAGNOSIS — F17200 Nicotine dependence, unspecified, uncomplicated: Secondary | ICD-10-CM

## 2014-01-28 MED ORDER — VARENICLINE TARTRATE 0.5 MG X 11 & 1 MG X 42 PO MISC: ORAL | Status: DC

## 2014-01-28 NOTE — Assessment & Plan Note (Signed)
>   3 min discussion I reviewed the Flethcher curve with patient that basically indicates  if you quit smoking when your best day FEV1 is still well preserved (as is the case here)  it is highly unlikely you will progress to severe disease and informed the patient there was no medication on the market that has proven to change the curve or the likelihood of progression.  Therefore stopping smoking and maintaining abstinence is the most important aspect of care, not choice of inhalers or for that matter, doctors.    Agreed to start chantix after discussion of risk/benefits

## 2014-01-28 NOTE — Patient Instructions (Addendum)
Chantix starter pack  Please remember to go to the xray  department downstairs for your tests - we will call you with the results when they are available.

## 2014-01-28 NOTE — Assessment & Plan Note (Signed)
See CT at [facility] 01/21/14 :  3 nodules 6 mm to 52mm, largest 2 in LLL   Discussed in detail all the  indications, usual  risks and alternatives  relative to the benefits with patient who agrees to proceed with repeat CT in 3 months and consider excisional bx on any that show interval growth.

## 2014-01-28 NOTE — Assessment & Plan Note (Signed)
 -   PFT's 02/09/2012 FEV1 1.51 (51%) ratio 42  And 21% better p saba, dlco 55% - 12/17/2013 p extensive coaching HFA effectiveness =    90%    Adequate control on present rx, reviewed > no change in rx needed  But note the location of worrisome nodules is LLL and would be a poor candidate for [employer]

## 2014-01-28 NOTE — Progress Notes (Signed)
Subjective:    Patient ID: Ernest Singleton, male    DOB: 01/14/1941   MRN: [id number]    Brief patient profile:  73  yowm active smoker no resp problems until around age 73 with nasal congestion then breathing problems onset around age 39 or so and gradually worse so referred 12/27/2011 to pulmonary clinic by Ernest Singleton with GOLD II/III COPD documented 02/09/2012   HPI 12/27/2011 1st pulmonary ov/ Ernest Singleton cc doe assoc with congested cough year round for sev years esp with hot humid weather, mucus is white usually occ yellow esp in am and again after supper. On advair x 4-5 year and combivent min improved. Nose is stuffy too.  Still not all that limited from desired activities from doe. No overt hb, no really purulent sputum or h/o hemoptysis or tendency to aecopd rec The key is to stop smoking completely before smoking completely stops you!  Symbicort 160 Take 2 puffs first thing in am and then another 2 puffs about 12 hours later - blow out thru nose use combivent only as needed      03/26/2013 f/u ov/Ernest Singleton re: GOLD II copd/ still smoking  Chief Complaint  Patient presents with  . Follow-up    Breathing is unchanged since last OV.  Having congestion, cough w/ mucus yellowish in color x1 month   saba x done daily "maybe around lunchtime"  or if gets in a real hurry at work, seems to help exert better Cough worse first thing in am p stirring then late in pm Using his symbicort in am first thing but second dose is at hs Has empty proaire, not able to use combivent respimat rec Augmentin 875 mg take one pill twice daily  X 10 days - take at breakfast and supper with large glass of water.  It would help reduce the usual side effects (diarrhea and yeast infections) if you ate cultured yogurt at lunch.   Symbicort Take 2 puffs first thing in am and then another 2 puffs about 12 hours later.  For cough best choice is mucinex dm up to 1200 mg every 12 hours  as needed The key is to stop smoking  completely before smoking completely stops you - this is the most important aspect of your care. Only use your albuterol(proaire) every 4 hours if needed   12/17/2013 f/u ov/Ernest Singleton re: copd GOLD II/ still smoking  Chief Complaint  Patient presents with  . Follow-up    Pt states his breathing seems to be slightly worse for the past several months. Humid weather makes breathing worse. He is using rescue inhaler once per day around noon usually.   Worse in heat- ok walking flat  Some congestion with thick white mucus esp in am, not using saba avg saba maybe once daily at most, never at night or first thing in am  rec No change rx   01/28/2014 f/u ov/Ernest Singleton re: GOLD II COPD / MPNs/ still smoking  Chief Complaint  Patient presents with  . Follow-up    Pt here per the request of Ernest Singleton to discuss abnormal ct chest. Pt states nodules were found on ct abd pelvis and then ct chest was done. Pt states that his breathing seems slightly worse since his last visit here- depends alot on the weather.    maint on symbicort 160 2bid and avg use of saba x 4 puffs per day, never noct  Each am> cough with  cream colored mucus x sev tbsp total  over the first hour Ok flat and slow in cool environment, can do [employer], no mall walking    No obvious day to day or daytime variabilty or assoc  cp or chest tightness, subjective wheeze overt sinus or hb symptoms. No unusual exp hx or h/o childhood pna/ asthma or knowledge of premature birth.  Sleeping ok without nocturnal  or early am exacerbation  of respiratory  c/o's or need for noct saba. Also denies any obvious fluctuation of symptoms with weather or environmental changes or other aggravating or alleviating factors except as outlined above   Current Medications, Allergies, Complete Past Medical History, Past Surgical History, Family History, and Social History were reviewed in [employer] record.  ROS  The following are not active  complaints unless bolded sore throat, dysphagia, dental problems, itching, sneezing,  nasal congestion or excess/ purulent secretions, ear ache,   fever, chills, sweats, unintended wt loss, pleuritic or exertional cp, hemoptysis,  orthopnea pnd or leg swelling, presyncope, palpitations, heartburn, abdominal pain, anorexia, nausea, vomiting, diarrhea  or change in bowel or urinary habits, change in stools or urine, dysuria,hematuria,  rash, arthralgias, visual complaints, headache, numbness weakness or ataxia or problems with walking or coordination,  change in mood/affect or memory.                      Objective:   Physical Exam 09/26/2012  182 >  03/26/2013  179 >  12/17/2013   176 >  01/28/2014  177  Wt Readings from Last 3 Encounters:  02/09/12 181 lb (82.101 kg)  12/27/11 182 lb (82.555 kg)     Pleasant amb hoarse  wm nad Edentulous/ congested rattling cough on FVC maneuver HEENT mild turbinate edema.  Oropharynx no thrush or excess pnd or cobblestoning.  No JVD or cervical adenopathy. Mild accessory muscle hypertrophy. Trachea midline, nl thryroid. Chest was hyperinflated by percussion with diminished breath sounds and moderate increased exp time without wheeze. Hoover sign positive at mid inspiration. Regular rate and rhythm without murmur gallop or rub or increase P2 or edema.  Abd: no hsm, nl excursion. Ext warm without cyanosis or clubbing.       CXR  01/28/2014 :   No viz nodules         Assessment & Plan:

## 2014-01-29 ENCOUNTER — Telehealth: Payer: Self-pay | Admitting: Internal Medicine

## 2014-01-29 NOTE — Telephone Encounter (Signed)
Spoke with pt and notified of results per Dr. Wert. Pt verbalized understanding and denied any questions. 

## 2014-01-29 NOTE — Progress Notes (Signed)
Quick Note:  Spoke with pt and notified of results per Dr. Wert. Pt verbalized understanding and denied any questions.  ______ 

## 2014-01-29 NOTE — Progress Notes (Signed)
Quick Note:  LMTCB ______ 

## 2014-01-30 ENCOUNTER — Other Ambulatory Visit: Payer: Self-pay | Admitting: Internal Medicine

## 2014-03-11 ENCOUNTER — Ambulatory Visit (INDEPENDENT_AMBULATORY_CARE_PROVIDER_SITE_OTHER): Payer: Medicare Other | Admitting: Internal Medicine

## 2014-03-11 ENCOUNTER — Encounter: Payer: Self-pay | Admitting: Internal Medicine

## 2014-03-11 VITALS — BP 120/70 | HR 51 | Temp 97.6°F | Wt 184.0 lb

## 2014-03-11 DIAGNOSIS — Z23 Encounter for immunization: Secondary | ICD-10-CM

## 2014-03-11 DIAGNOSIS — J449 Chronic obstructive pulmonary disease, unspecified: Secondary | ICD-10-CM

## 2014-03-11 DIAGNOSIS — R918 Other nonspecific abnormal finding of lung field: Secondary | ICD-10-CM

## 2014-03-11 DIAGNOSIS — Z720 Tobacco use: Secondary | ICD-10-CM

## 2014-03-11 DIAGNOSIS — F17200 Nicotine dependence, unspecified, uncomplicated: Secondary | ICD-10-CM

## 2014-03-11 LAB — PULMONARY FUNCTION TEST
DL/VA % pred: 46 %
DL/VA: 2.09 ml/min/mmHg/L
DLCO unc % pred: 42 %
DLCO unc: 12.61 ml/min/mmHg
FEF 25-75 Post: 0.51 L/sec
FEF 25-75 Pre: 0.55 L/sec
FEF2575-%CHANGE-POST: -7 %
FEF2575-%Pred-Post: 24 %
FEF2575-%Pred-Pre: 26 %
FEV1-%CHANGE-POST: -6 %
FEV1-%PRED-POST: 47 %
FEV1-%Pred-Pre: 50 %
FEV1-PRE: 1.44 L
FEV1-Post: 1.34 L
FEV1FVC-%Change-Post: -8 %
FEV1FVC-%PRED-PRE: 67 %
FEV6-%Change-Post: 2 %
FEV6-%Pred-Post: 77 %
FEV6-%Pred-Pre: 75 %
FEV6-POST: 2.85 L
FEV6-Pre: 2.78 L
FEV6FVC-%CHANGE-POST: 0 %
FEV6FVC-%Pred-Post: 101 %
FEV6FVC-%Pred-Pre: 101 %
FVC-%Change-Post: 2 %
FVC-%Pred-Post: 75 %
FVC-%Pred-Pre: 74 %
FVC-Post: 2.99 L
FVC-Pre: 2.92 L
POST FEV1/FVC RATIO: 45 %
POST FEV6/FVC RATIO: 95 %
PRE FEV6/FVC RATIO: 95 %
Pre FEV1/FVC ratio: 49 %
RV % PRED: 159 %
RV: 3.89 L
TLC % pred: 111 %
TLC: 7.39 L

## 2014-03-11 NOTE — Progress Notes (Signed)
PFT done today. 

## 2014-03-11 NOTE — Patient Instructions (Addendum)
No change in medications  We will contact you to follow up your nodules in mid November and go from there  No regular pulmonary follow up needed for your copd which is only moderate is severity and unlikely to progress unless you resume smoking   Congratulations on stopping smoking - it's the most important aspect of your Care!

## 2014-03-11 NOTE — Progress Notes (Signed)
Subjective:    Patient ID: MARQUE RADEMAKER, male    DOB: 17-Jan-1941   MRN: [id number]    Brief patient profile:  73  yowm active smoker no resp problems until around age 73 with nasal congestion then breathing problems onset around age 73 or so and gradually worse so referred 12/27/2011 to pulmonary clinic by Dr Melina Copa with GOLD II/III COPD documented 02/09/2012   HPI 12/27/2011 1st pulmonary ov/ [facility] Wicklund [facility] with congested cough year round for sev years esp with hot humid weather, mucus is white usually occ yellow esp in am and again after supper. On advair x 4-5 year and combivent min improved. Nose is stuffy too.  Still not all that limited from desired activities from doe. No overt hb, no really purulent sputum or h/o hemoptysis or tendency to aecopd rec The key is to stop smoking completely before smoking completely stops you!  Symbicort 160 Take 2 puffs first thing in am and then another 2 puffs about 12 hours later - blow out thru nose use combivent only as needed   03/26/2013 f/u ov/Claryssa Sandner re: GOLD II copd/ still smoking  Chief Complaint  Patient presents with  . Follow-up    Breathing is unchanged since last OV.  Having congestion, cough w/ mucus yellowish in color x1 month   saba x done daily "maybe around lunchtime"  or if gets in a real hurry at work, seems to help exert better Cough worse first thing in am p stirring then late in pm Using his symbicort in am first thing but second dose is at hs Has empty proaire, not able to use combivent respimat rec Augmentin 875 mg take one pill twice daily  X 10 days - take at breakfast and supper with large glass of water.  It would help reduce the usual side effects (diarrhea and yeast infections) if you ate cultured yogurt at lunch.   Symbicort Take 2 puffs first thing in am and then another 2 puffs about 12 hours later.  For cough best choice is mucinex dm up to 1200 mg every 12 hours  as needed The key is to stop smoking completely  before smoking completely stops you - this is the most important aspect of your care. Only use your albuterol(proaire) every 4 hours if needed    01/28/2014 f/u ov/Danyah Guastella re: GOLD II COPD / MPNs/ still smoking  Chief Complaint  Patient presents with  . Follow-up    Pt here per the request of Dr Alinda Money to discuss abnormal ct chest. Pt states nodules were found on ct abd pelvis and then ct chest was done. Pt states that his breathing seems slightly worse since his last visit here- depends alot on the weather.    maint on symbicort 160 2bid and avg use of saba x 4 puffs per day, never noct  Each am> cough with  cream colored mucus x sev tbsp total over the first hour Ok flat and slow in cool environment, can do [employer], no mall walking  rec Chantix starter pack   03/11/2014 f/u ov/Joron Velis re: COPD GOLD II/III on symbicort and prn saba / no cigs since Sept 1 2015  Chief Complaint  Patient presents with  . Follow-up    PFT done today.   Not limited by breathing from desired activities   Rare saba need   No obvious day to day or daytime variabilty or assoc cough  cp or chest tightness, subjective wheeze overt sinus or hb  symptoms. No unusual exp hx or h/o childhood pna/ asthma or knowledge of premature birth.  Sleeping ok without nocturnal  or early am exacerbation  of respiratory  c/o's or need for noct saba. Also denies any obvious fluctuation of symptoms with weather or environmental changes or other aggravating or alleviating factors except as outlined above   Current Medications, Allergies, Complete Past Medical History, Past Surgical History, Family History, and Social History were reviewed in [employer] record.  ROS  The following are not active complaints unless bolded sore throat, dysphagia, dental problems, itching, sneezing,  nasal congestion or excess/ purulent secretions, ear ache,   fever, chills, sweats, unintended wt loss, pleuritic or exertional cp,  hemoptysis,  orthopnea pnd or leg swelling, presyncope, palpitations, heartburn, abdominal pain, anorexia, nausea, vomiting, diarrhea  or change in bowel or urinary habits, change in stools or urine, dysuria,hematuria,  rash, arthralgias, visual complaints, headache, numbness weakness or ataxia or problems with walking or coordination,  change in mood/affect or memory.            Objective:   Physical Exam 09/26/2012  182 >  03/26/2013  179 >  12/17/2013   176 >  01/28/2014  177 > 03/11/2014  184 Wt Readings from Last 3 Encounters:  02/09/12 181 lb (82.101 kg)  12/27/11 182 lb (82.555 kg)     Pleasant amb hoarse  wm nad Edentulous/ congested rattling cough on FVC maneuver HEENT mild turbinate edema.  Oropharynx no thrush or excess pnd or cobblestoning.  No JVD or cervical adenopathy. Mild accessory muscle hypertrophy. Trachea midline, nl thryroid. Chest was hyperinflated by percussion with diminished breath sounds and moderate increased exp time without wheeze. Hoover sign positive at mid inspiration. Regular rate and rhythm without murmur gallop or rub or increase P2 or edema.  Abd: no hsm, nl excursion. Ext warm without cyanosis or clubbing.       CXR  01/28/2014 :   No viz nodules         Assessment & Plan:

## 2014-03-12 ENCOUNTER — Encounter: Payer: Self-pay | Admitting: Internal Medicine

## 2014-03-12 NOTE — Assessment & Plan Note (Signed)
-   PFT's 02/09/2012 FEV1 1.51 (51%) ratio 42  And 21% better p saba, dlco 55%  - PFT's 03/11/2014  FEV 1.44 (50%) ratio 49 no better p saba, dlco 42% no rx prior on maint symbicort and rare sab  -  12/17/2013 p extensive coaching HFA effectiveness =    90%    > 3 min discussion I reviewed the Flethcher curve with patient that basically indicates  if you quit smoking when your best day FEV1 is still well preserved (as is the case here)  it is highly unlikely you will progress to severe disease and informed the patient there was no medication on the market that has proven to change the curve or the likelihood of progression.  Therefore stopping smoking and maintaining abstinence is the most important aspect of care, not choice of inhalers or for that matter, doctors.    No change rx needed, could add lama if limited from desired activities

## 2014-03-12 NOTE — Assessment & Plan Note (Signed)
See [facility] 01/21/14 :  3 nodules 6 mm to 99mm, largest 2 in [facility] > in computer for recall CT 04/23/14   Discussed in detail all the  indications, usual  risks and alternatives  relative to the benefits with patient who agrees to proceed with conservative f/u as planned

## 2014-03-12 NOTE — Assessment & Plan Note (Signed)
-   trial of chantix 01/28/2014 > quit 02/05/14   Reinforced importance of abstinence     Each maintenance medication was reviewed in detail including most importantly the difference between maintenance and as needed and under what circumstances the prns are to be used.  Please see instructions for details which were reviewed in writing and the patient given a copy.

## 2014-03-18 ENCOUNTER — Ambulatory Visit: Payer: Medicare Other | Admitting: Internal Medicine

## 2014-04-23 ENCOUNTER — Other Ambulatory Visit: Payer: Self-pay | Admitting: Internal Medicine

## 2014-04-23 DIAGNOSIS — R918 Other nonspecific abnormal finding of lung field: Secondary | ICD-10-CM

## 2014-04-30 ENCOUNTER — Ambulatory Visit (INDEPENDENT_AMBULATORY_CARE_PROVIDER_SITE_OTHER)
Admission: RE | Admit: 2014-04-30 | Discharge: 2014-04-30 | Disposition: A | Discharge status: Home / Self Care | Payer: Medicare Other | Source: Ambulatory Visit | Attending: Internal Medicine | Admitting: Internal Medicine

## 2014-04-30 DIAGNOSIS — R918 Other nonspecific abnormal finding of lung field: Secondary | ICD-10-CM

## 2014-05-03 NOTE — Progress Notes (Signed)
Quick Note:  Spoke with pt and notified of results per Dr. Wert. Pt verbalized understanding and denied any questions.  ______ 

## 2014-06-12 ENCOUNTER — Other Ambulatory Visit: Payer: Self-pay | Admitting: Internal Medicine

## 2014-06-12 MED ORDER — AZELASTINE-FLUTICASONE 137-50 MCG/ACT NA SUSP: 2.0000 | NASAL | Status: DC | PRN

## 2014-06-13 ENCOUNTER — Telehealth: Payer: Self-pay | Admitting: Internal Medicine

## 2014-06-13 NOTE — Telephone Encounter (Signed)
Received a fax from pharmacy requesting a PA for [employer], no contact #, key for [email or web address] given. PA completed online. Will await response.   Key# [id number]

## 2014-07-03 NOTE — Telephone Encounter (Signed)
Leda Min! Can you check on this PA for this pt to see if it was approved! Thanks!

## 2014-07-04 NOTE — Telephone Encounter (Signed)
Called [place] [facility] at [phone] They were not able to pull up the pt b/c he does not have medicaid, he has medicare  I called the pharmacy but they were closed for lunch  Dr Melvyn Novas- a lot of the time dymista does not get coverage  Do you want to call in the flonase and astelin separately? Please advise thanks!

## 2014-07-04 NOTE — Telephone Encounter (Signed)
That’s fine with me

## 2014-07-04 NOTE — Telephone Encounter (Signed)
LMTCB for the pt to see if okay with him

## 2014-12-25 ENCOUNTER — Encounter: Payer: Self-pay | Admitting: Internal Medicine

## 2014-12-25 ENCOUNTER — Ambulatory Visit (INDEPENDENT_AMBULATORY_CARE_PROVIDER_SITE_OTHER)
Admission: RE | Admit: 2014-12-25 | Discharge: 2014-12-25 | Disposition: A | Discharge status: Home / Self Care | Payer: Medicare Other | Source: Ambulatory Visit | Attending: Internal Medicine | Admitting: Internal Medicine

## 2014-12-25 ENCOUNTER — Ambulatory Visit (INDEPENDENT_AMBULATORY_CARE_PROVIDER_SITE_OTHER): Payer: Medicare Other | Admitting: Internal Medicine

## 2014-12-25 VITALS — BP 122/80 | HR 66 | Ht 69.0 in | Wt 171.2 lb

## 2014-12-25 DIAGNOSIS — F17210 Nicotine dependence, cigarettes, uncomplicated: Secondary | ICD-10-CM

## 2014-12-25 DIAGNOSIS — Z720 Tobacco use: Secondary | ICD-10-CM | POA: Diagnosis not present

## 2014-12-25 DIAGNOSIS — J449 Chronic obstructive pulmonary disease, unspecified: Secondary | ICD-10-CM

## 2014-12-25 MED ORDER — PREDNISONE 10 MG PO TABS: ORAL_TABLET | ORAL | Status: DC

## 2014-12-25 MED ORDER — VARENICLINE TARTRATE 0.5 MG X 11 & 1 MG X 42 PO MISC: ORAL | Status: DC

## 2014-12-25 NOTE — Patient Instructions (Addendum)
Prednisone 10 mg take  4 each am x 2 days,   2 each am x 2 days,  1 each am x 2 days and stop   Only use your albuterol (proair) as a rescue medication to be used if you can't catch your breath by resting or doing a relaxed purse lip breathing pattern.  - The less you use it, the better it will work when you need it. - Ok to use up to 2 puffs  every 4 hours if you must but call for immediate appointment if use goes up over your usual need - Don't leave home without it !!  (think of it like the spare tire for your car)   The key is to stop smoking completely before smoking completely stops you!   chantix starter pack  Please remember to go to the x-ray department downstairs for your tests - we will call you with the results when they are available.     Return in 4 weeks if not better to your satisfaction as may need to do add on to symbicort maintenance

## 2014-12-25 NOTE — Progress Notes (Signed)
Subjective:    Patient ID: Ernest Singleton, male    DOB: May 17, 1941   MRN: [id number]    Brief patient profile:  74  yowm active smoker no resp problems until around age 74 with nasal congestion then breathing problems onset around age 74 or so and gradually worse so referred 12/27/2011 to pulmonary clinic by Dr Melina Copa with GOLD II/III COPD documented 02/09/2012   HPI 12/27/2011 1st pulmonary ov/ Nasiya Pascual [facility] with congested cough year round for sev years esp with hot humid weather, mucus is white usually occ yellow esp in am and again after supper. On advair x 4-5 year and combivent min improved. Nose is stuffy too.  Still not all that limited from desired activities from doe. No overt hb, no really purulent sputum or h/o hemoptysis or tendency to aecopd rec The key is to stop smoking completely before smoking completely stops you!  Symbicort 160 Take 2 puffs first thing in am and then another 2 puffs about 12 hours later - blow out thru nose use combivent only as needed   03/26/2013 f/u ov/Leeah Politano re: GOLD II copd/ still smoking  Chief Complaint  Patient presents with  . Follow-up    Breathing is unchanged since last OV.  Having congestion, cough w/ mucus yellowish in color x1 month   saba x done daily "maybe around lunchtime"  or if gets in a real hurry at work, seems to help exert better Cough worse first thing in am p stirring then late in pm Using his symbicort in am first thing but second dose is at hs Has empty proaire, not able to use combivent respimat rec Augmentin 875 mg take one pill twice daily  X 10 days - take at breakfast and supper with large glass of water.  It would help reduce the usual side effects (diarrhea and yeast infections) if you ate cultured yogurt at lunch.   Symbicort Take 2 puffs first thing in am and then another 2 puffs about 12 hours later.  For cough best choice is mucinex dm up to 1200 mg every 12 hours  as needed The key is to stop smoking completely  before smoking completely stops you - this is the most important aspect of your care. Only use your albuterol(proaire) every 4 hours if needed    01/28/2014 f/u ov/Haaris Metallo re: GOLD II COPD / MPNs/ still smoking  Chief Complaint  Patient presents with  . Follow-up    Pt here per the request of Dr Alinda Money to discuss abnormal ct chest. Pt states nodules were found on ct abd pelvis and then ct chest was done. Pt states that his breathing seems slightly worse since his last visit here- depends alot on the weather.    maint on symbicort 160 2bid and avg use of saba x 4 puffs per day, never noct  Each am> cough with  cream colored mucus x sev tbsp total over the first hour Ok flat and slow in cool environment, can do [employer], no mall walking  rec Chantix starter pack   03/11/2014 f/u ov/Zhoey Blackstock re: COPD GOLD II/III on symbicort and prn saba / no cigs since Sept 1 2015  Chief Complaint  Patient presents with  . Follow-up    PFT done today.   Not limited by breathing from desired activities   Rare saba need rec No change in medications We will contact you to follow up your nodules in mid November and go from there> no change >  nothing further f/u  No regular pulmonary follow up needed for your copd which is only moderate is severity and unlikely to progress unless you resume smoking  Congratulations on stopping smoking - it's the most important aspect of your Care!   12/25/2014 f/u ov/Macrina Lehnert re: copd II/III resumed smoking /  symbicort 160 2bid  Chief Complaint  Patient presents with  . Follow-up    Pt c/o worsening SOB, wheezing, chest pain, and semi-productive cough. Denies fevers, hemoptysis or edema   saba once daily  / more in heat doing services Cough/ congestion/ Sputtering when gets up x 30 min each am am mucus mostly white ex in am  only then slt yellow/ already got one round of abx per Particia Nearing > transiently some better    No obvious day to day or daytime variabilty or assoc  overt  sinus or hb symptoms. No unusual exp hx or h/o childhood pna/ asthma or knowledge of premature birth.  Sleeping ok without nocturnal  or early am exacerbation  of respiratory  c/o's or need for noct saba. Also denies any obvious fluctuation of symptoms with weather or environmental changes or other aggravating or alleviating factors except as outlined above   Current Medications, Allergies, Complete Past Medical History, Past Surgical History, Family History, and Social History were reviewed in [employer] record.  ROS  The following are not active complaints unless bolded sore throat, dysphagia, dental problems, itching, sneezing,  nasal congestion or excess/ purulent secretions, ear ache,   fever, chills, sweats, unintended wt loss, pleuritic or exertional cp, hemoptysis,  orthopnea pnd or leg swelling, presyncope, palpitations, heartburn, abdominal pain, anorexia, nausea, vomiting, diarrhea  or change in bowel or urinary habits, change in stools or urine, dysuria,hematuria,  rash, arthralgias, visual complaints, headache, numbness weakness or ataxia or problems with walking or coordination,  change in mood/affect or memory.            Objective:  Physical Exam  09/26/2012  182 >  03/26/2013  179 >  12/17/2013   176 >  01/28/2014  177 > 03/11/2014  184> 12/25/2014  171  Wt Readings from Last 3 Encounters:  02/09/12 181 lb (82.101 kg)  12/27/11 182 lb (82.555 kg)     Pleasant amb hoarse  wm nad  Edentulous/ congested rattling cough on FVC maneuver HEENT mild turbinate edema.  Oropharynx no thrush or excess pnd or cobblestoning.  No JVD or cervical adenopathy. Mild accessory muscle hypertrophy. Trachea midline, nl thryroid. Chest was hyperinflated by percussion with diminished breath sounds and moderate increased exp time without wheeze. Hoover sign positive at mid inspiration. Regular rate and rhythm without murmur gallop or rub or increase P2 or edema.  Abd: no hsm, nl  excursion. Ext warm without cyanosis or clubbing.         CXR PA and Lateral:   12/25/2014 :     I personally reviewed images and agree with radiology impression as follows:    COPD. There is no pneumonia, CHF, nor other active cardiopulmonary disease.    Assessment & Plan:

## 2014-12-25 NOTE — Assessment & Plan Note (Signed)

## 2014-12-26 NOTE — Progress Notes (Signed)
Quick Note:  Called and spoke with pt. Reviewed results and recs. Pt voiced understanding and had no further questions. ______ 

## 2014-12-29 ENCOUNTER — Encounter: Payer: Self-pay | Admitting: Internal Medicine

## 2014-12-29 NOTE — Assessment & Plan Note (Addendum)
-   PFT's 02/09/2012 FEV1 1.51 (51%) ratio 42  And 21% better p saba, dlco 55%  - PFT's 03/11/2014  FEV 1.44 (50%) ratio 49 no better p saba, dlco 42% no rx prior on maint symbicort and rare sab  -  7/202015 p extensive coaching HFA effectiveness =    90%    DDX of  difficult airways management all start with A and  include Adherence, Ace Inhibitors, Acid Reflux, Active Sinus Disease, Alpha 1 Antitripsin deficiency, Anxiety masquerading as Airways dz,  ABPA,  allergy(esp in young), Aspiration (esp in elderly), Adverse effects of meds,  Active smokers, A bunch of PE's (a small clot burden can't cause this syndrome unless there is already severe underlying pulm or vascular dz with poor reserve) plus two Bs  = Bronchiectasis and Beta blocker use..and one C= CHF   Adherence is always the initial "prime suspect" and is a multilayered concern that requires a "trust but verify" approach in every patient - starting with knowing how to use medications, especially inhalers, correctly, keeping up with refills and understanding the fundamental difference between maintenance and prns vs those medications only taken for a very short course and then stopped and not refilled.  - The proper method of use, as well as anticipated side effects, of a metered-dose inhaler are discussed and demonstrated to the patient. Improved effectiveness after extensive coaching during this visit to a level of approximately  90%   Active smoking greatest concern here > see sep a/p  ? Allergy / asthma component > Prednisone 10 mg take  4 each am x 2 days,   2 each am x 2 days,  1 each am x 2 days and stop  ? Active sinus dz > doubt but may need f/u sinus ct as does report improves with abx but suspect this is the cb component from smoking   I had an extended discussion with the patient reviewing all relevant studies completed to date and  lasting 15 to 20 minutes of a 25 minute visit    Each maintenance medication was reviewed in detail  including most importantly the difference between maintenance and prns and under what circumstances the prns are to be triggered using an action plan format that is not reflected in the computer generated alphabetically organized AVS.    Please see instructions for details which were reviewed in writing and the patient given a copy highlighting the part that I personally wrote and discussed at today's ov.

## 2015-07-09 ENCOUNTER — Other Ambulatory Visit: Payer: Self-pay | Admitting: Internal Medicine

## 2015-07-09 MED ORDER — ALBUTEROL SULFATE HFA 108 (90 BASE) MCG/ACT IN AERS: 2.0000 | INHALATION_SPRAY | Freq: Four times a day (QID) | RESPIRATORY_TRACT | Status: DC | PRN

## 2016-04-24 ENCOUNTER — Other Ambulatory Visit: Payer: Self-pay | Admitting: Physician Assistant

## 2016-06-08 ENCOUNTER — Other Ambulatory Visit: Payer: Self-pay | Admitting: Physician Assistant

## 2016-09-06 ENCOUNTER — Other Ambulatory Visit: Payer: Self-pay | Admitting: Physician Assistant

## 2016-10-26 ENCOUNTER — Other Ambulatory Visit: Payer: Self-pay | Admitting: Physician Assistant

## 2016-11-16 ENCOUNTER — Other Ambulatory Visit: Payer: Self-pay | Admitting: Physician Assistant

## 2017-12-20 ENCOUNTER — Other Ambulatory Visit: Payer: Self-pay | Admitting: Physician Assistant

## 2017-12-21 ENCOUNTER — Other Ambulatory Visit: Payer: Self-pay | Admitting: Urology

## 2017-12-21 DIAGNOSIS — R972 Elevated prostate specific antigen [PSA]: Secondary | ICD-10-CM

## 2018-01-03 ENCOUNTER — Inpatient Hospital Stay: Admission: RE | Admit: 2018-01-03 | Payer: Medicare Other | Source: Ambulatory Visit

## 2018-01-19 ENCOUNTER — Ambulatory Visit
Admission: RE | Admit: 2018-01-19 | Discharge: 2018-01-19 | Disposition: A | Discharge status: Home / Self Care | Payer: Medicare HMO | Source: Ambulatory Visit | Attending: Urology | Admitting: Urology

## 2018-01-19 DIAGNOSIS — R972 Elevated prostate specific antigen [PSA]: Secondary | ICD-10-CM

## 2018-01-19 MED ORDER — GADOBENATE DIMEGLUMINE 529 MG/ML IV SOLN
15.0000 mL | Freq: Once | INTRAVENOUS | Status: AC | PRN
  Administered 2018-01-19: 15 mL via INTRAVENOUS

## 2019-01-08 DIAGNOSIS — F17210 Nicotine dependence, cigarettes, uncomplicated: Secondary | ICD-10-CM | POA: Diagnosis not present

## 2019-01-08 DIAGNOSIS — M170 Bilateral primary osteoarthritis of knee: Secondary | ICD-10-CM | POA: Diagnosis not present

## 2019-01-08 DIAGNOSIS — N401 Enlarged prostate with lower urinary tract symptoms: Secondary | ICD-10-CM | POA: Diagnosis not present

## 2019-01-08 DIAGNOSIS — E559 Vitamin D deficiency, unspecified: Secondary | ICD-10-CM | POA: Diagnosis not present

## 2019-01-08 DIAGNOSIS — J431 Panlobular emphysema: Secondary | ICD-10-CM | POA: Diagnosis not present

## 2019-01-18 DIAGNOSIS — J449 Chronic obstructive pulmonary disease, unspecified: Secondary | ICD-10-CM | POA: Diagnosis not present

## 2019-01-18 DIAGNOSIS — R918 Other nonspecific abnormal finding of lung field: Secondary | ICD-10-CM | POA: Diagnosis not present

## 2019-01-18 DIAGNOSIS — R911 Solitary pulmonary nodule: Secondary | ICD-10-CM | POA: Diagnosis not present

## 2019-01-18 DIAGNOSIS — J9811 Atelectasis: Secondary | ICD-10-CM | POA: Diagnosis not present

## 2019-01-19 DIAGNOSIS — N401 Enlarged prostate with lower urinary tract symptoms: Secondary | ICD-10-CM | POA: Diagnosis not present

## 2019-01-19 DIAGNOSIS — R3912 Poor urinary stream: Secondary | ICD-10-CM | POA: Diagnosis not present

## 2019-01-19 DIAGNOSIS — R972 Elevated prostate specific antigen [PSA]: Secondary | ICD-10-CM | POA: Diagnosis not present

## 2019-01-22 DIAGNOSIS — R918 Other nonspecific abnormal finding of lung field: Secondary | ICD-10-CM | POA: Diagnosis not present

## 2019-01-22 DIAGNOSIS — J9611 Chronic respiratory failure with hypoxia: Secondary | ICD-10-CM | POA: Diagnosis not present

## 2019-01-22 DIAGNOSIS — F17210 Nicotine dependence, cigarettes, uncomplicated: Secondary | ICD-10-CM | POA: Diagnosis not present

## 2019-01-22 DIAGNOSIS — J449 Chronic obstructive pulmonary disease, unspecified: Secondary | ICD-10-CM | POA: Diagnosis not present

## 2019-02-03 DIAGNOSIS — J449 Chronic obstructive pulmonary disease, unspecified: Secondary | ICD-10-CM | POA: Diagnosis not present

## 2019-02-03 DIAGNOSIS — Z20828 Contact with and (suspected) exposure to other viral communicable diseases: Secondary | ICD-10-CM | POA: Diagnosis not present

## 2019-02-03 DIAGNOSIS — Z01812 Encounter for preprocedural laboratory examination: Secondary | ICD-10-CM | POA: Diagnosis not present

## 2019-02-07 DIAGNOSIS — Z7951 Long term (current) use of inhaled steroids: Secondary | ICD-10-CM | POA: Diagnosis not present

## 2019-02-07 DIAGNOSIS — R911 Solitary pulmonary nodule: Secondary | ICD-10-CM | POA: Diagnosis not present

## 2019-02-07 DIAGNOSIS — J9809 Other diseases of bronchus, not elsewhere classified: Secondary | ICD-10-CM | POA: Diagnosis not present

## 2019-02-07 DIAGNOSIS — J449 Chronic obstructive pulmonary disease, unspecified: Secondary | ICD-10-CM | POA: Diagnosis not present

## 2019-02-07 DIAGNOSIS — Z79899 Other long term (current) drug therapy: Secondary | ICD-10-CM | POA: Diagnosis not present

## 2019-02-07 DIAGNOSIS — M1990 Unspecified osteoarthritis, unspecified site: Secondary | ICD-10-CM | POA: Diagnosis not present

## 2019-02-07 DIAGNOSIS — Z888 Allergy status to other drugs, medicaments and biological substances status: Secondary | ICD-10-CM | POA: Diagnosis not present

## 2019-02-07 DIAGNOSIS — J9601 Acute respiratory failure with hypoxia: Secondary | ICD-10-CM | POA: Diagnosis not present

## 2019-02-07 DIAGNOSIS — F17210 Nicotine dependence, cigarettes, uncomplicated: Secondary | ICD-10-CM | POA: Diagnosis not present

## 2019-02-14 DIAGNOSIS — J449 Chronic obstructive pulmonary disease, unspecified: Secondary | ICD-10-CM | POA: Diagnosis not present

## 2019-02-14 DIAGNOSIS — R918 Other nonspecific abnormal finding of lung field: Secondary | ICD-10-CM | POA: Diagnosis not present

## 2019-02-14 DIAGNOSIS — J9611 Chronic respiratory failure with hypoxia: Secondary | ICD-10-CM | POA: Diagnosis not present

## 2019-02-14 DIAGNOSIS — F17210 Nicotine dependence, cigarettes, uncomplicated: Secondary | ICD-10-CM | POA: Diagnosis not present

## 2019-02-14 DIAGNOSIS — Z23 Encounter for immunization: Secondary | ICD-10-CM | POA: Diagnosis not present

## 2019-02-18 DIAGNOSIS — J449 Chronic obstructive pulmonary disease, unspecified: Secondary | ICD-10-CM | POA: Diagnosis not present

## 2019-02-25 DIAGNOSIS — R069 Unspecified abnormalities of breathing: Secondary | ICD-10-CM | POA: Diagnosis not present

## 2019-02-25 DIAGNOSIS — Z87891 Personal history of nicotine dependence: Secondary | ICD-10-CM | POA: Diagnosis not present

## 2019-02-25 DIAGNOSIS — R0902 Hypoxemia: Secondary | ICD-10-CM | POA: Diagnosis not present

## 2019-02-25 DIAGNOSIS — R0602 Shortness of breath: Secondary | ICD-10-CM | POA: Diagnosis not present

## 2019-02-25 DIAGNOSIS — J189 Pneumonia, unspecified organism: Secondary | ICD-10-CM | POA: Diagnosis not present

## 2019-02-25 DIAGNOSIS — Z79899 Other long term (current) drug therapy: Secondary | ICD-10-CM | POA: Diagnosis not present

## 2019-02-25 DIAGNOSIS — J441 Chronic obstructive pulmonary disease with (acute) exacerbation: Secondary | ICD-10-CM | POA: Diagnosis not present

## 2019-02-25 DIAGNOSIS — R0600 Dyspnea, unspecified: Secondary | ICD-10-CM | POA: Diagnosis not present

## 2019-02-25 DIAGNOSIS — R0689 Other abnormalities of breathing: Secondary | ICD-10-CM | POA: Diagnosis not present

## 2019-03-01 DIAGNOSIS — Z6823 Body mass index (BMI) 23.0-23.9, adult: Secondary | ICD-10-CM | POA: Diagnosis not present

## 2019-03-01 DIAGNOSIS — J449 Chronic obstructive pulmonary disease, unspecified: Secondary | ICD-10-CM | POA: Diagnosis not present

## 2019-03-01 DIAGNOSIS — N400 Enlarged prostate without lower urinary tract symptoms: Secondary | ICD-10-CM | POA: Diagnosis not present

## 2019-03-01 DIAGNOSIS — E7800 Pure hypercholesterolemia, unspecified: Secondary | ICD-10-CM | POA: Diagnosis not present

## 2019-03-01 DIAGNOSIS — Z299 Encounter for prophylactic measures, unspecified: Secondary | ICD-10-CM | POA: Diagnosis not present

## 2019-03-01 DIAGNOSIS — F17210 Nicotine dependence, cigarettes, uncomplicated: Secondary | ICD-10-CM | POA: Diagnosis not present

## 2019-03-01 DIAGNOSIS — E559 Vitamin D deficiency, unspecified: Secondary | ICD-10-CM | POA: Diagnosis not present

## 2019-03-06 DIAGNOSIS — F17210 Nicotine dependence, cigarettes, uncomplicated: Secondary | ICD-10-CM | POA: Diagnosis not present

## 2019-03-06 DIAGNOSIS — J9611 Chronic respiratory failure with hypoxia: Secondary | ICD-10-CM | POA: Diagnosis not present

## 2019-03-06 DIAGNOSIS — J449 Chronic obstructive pulmonary disease, unspecified: Secondary | ICD-10-CM | POA: Diagnosis not present

## 2019-03-06 DIAGNOSIS — R918 Other nonspecific abnormal finding of lung field: Secondary | ICD-10-CM | POA: Diagnosis not present

## 2019-03-20 DIAGNOSIS — J449 Chronic obstructive pulmonary disease, unspecified: Secondary | ICD-10-CM | POA: Diagnosis not present

## 2019-04-05 DIAGNOSIS — J449 Chronic obstructive pulmonary disease, unspecified: Secondary | ICD-10-CM | POA: Diagnosis not present

## 2019-04-20 DIAGNOSIS — J449 Chronic obstructive pulmonary disease, unspecified: Secondary | ICD-10-CM | POA: Diagnosis not present

## 2019-05-06 DIAGNOSIS — J449 Chronic obstructive pulmonary disease, unspecified: Secondary | ICD-10-CM | POA: Diagnosis not present

## 2019-05-15 DIAGNOSIS — F17210 Nicotine dependence, cigarettes, uncomplicated: Secondary | ICD-10-CM | POA: Diagnosis not present

## 2019-05-15 DIAGNOSIS — M889 Osteitis deformans of unspecified bone: Secondary | ICD-10-CM | POA: Diagnosis not present

## 2019-05-15 DIAGNOSIS — J441 Chronic obstructive pulmonary disease with (acute) exacerbation: Secondary | ICD-10-CM | POA: Diagnosis not present

## 2019-05-17 DIAGNOSIS — J449 Chronic obstructive pulmonary disease, unspecified: Secondary | ICD-10-CM | POA: Diagnosis not present

## 2019-05-20 DIAGNOSIS — J449 Chronic obstructive pulmonary disease, unspecified: Secondary | ICD-10-CM | POA: Diagnosis not present

## 2019-05-30 DIAGNOSIS — Z299 Encounter for prophylactic measures, unspecified: Secondary | ICD-10-CM | POA: Diagnosis not present

## 2019-05-30 DIAGNOSIS — Z0000 Encounter for general adult medical examination without abnormal findings: Secondary | ICD-10-CM | POA: Diagnosis not present

## 2019-05-30 DIAGNOSIS — Z1211 Encounter for screening for malignant neoplasm of colon: Secondary | ICD-10-CM | POA: Diagnosis not present

## 2019-05-30 DIAGNOSIS — F17210 Nicotine dependence, cigarettes, uncomplicated: Secondary | ICD-10-CM | POA: Diagnosis not present

## 2019-05-30 DIAGNOSIS — Z125 Encounter for screening for malignant neoplasm of prostate: Secondary | ICD-10-CM | POA: Diagnosis not present

## 2019-05-30 DIAGNOSIS — E7800 Pure hypercholesterolemia, unspecified: Secondary | ICD-10-CM | POA: Diagnosis not present

## 2019-05-30 DIAGNOSIS — Z6823 Body mass index (BMI) 23.0-23.9, adult: Secondary | ICD-10-CM | POA: Diagnosis not present

## 2019-05-30 DIAGNOSIS — Z7189 Other specified counseling: Secondary | ICD-10-CM | POA: Diagnosis not present

## 2019-05-30 DIAGNOSIS — R5383 Other fatigue: Secondary | ICD-10-CM | POA: Diagnosis not present

## 2019-05-30 DIAGNOSIS — Z79899 Other long term (current) drug therapy: Secondary | ICD-10-CM | POA: Diagnosis not present

## 2019-05-30 DIAGNOSIS — Z1331 Encounter for screening for depression: Secondary | ICD-10-CM | POA: Diagnosis not present

## 2019-05-30 DIAGNOSIS — Z1339 Encounter for screening examination for other mental health and behavioral disorders: Secondary | ICD-10-CM | POA: Diagnosis not present

## 2019-05-31 DIAGNOSIS — J449 Chronic obstructive pulmonary disease, unspecified: Secondary | ICD-10-CM | POA: Diagnosis not present

## 2019-05-31 DIAGNOSIS — F17210 Nicotine dependence, cigarettes, uncomplicated: Secondary | ICD-10-CM | POA: Diagnosis not present

## 2019-05-31 DIAGNOSIS — R918 Other nonspecific abnormal finding of lung field: Secondary | ICD-10-CM | POA: Diagnosis not present

## 2019-05-31 DIAGNOSIS — J9611 Chronic respiratory failure with hypoxia: Secondary | ICD-10-CM | POA: Diagnosis not present

## 2019-05-31 DIAGNOSIS — R0602 Shortness of breath: Secondary | ICD-10-CM | POA: Diagnosis not present

## 2019-06-05 DIAGNOSIS — J449 Chronic obstructive pulmonary disease, unspecified: Secondary | ICD-10-CM | POA: Diagnosis not present

## 2019-06-11 DIAGNOSIS — L570 Actinic keratosis: Secondary | ICD-10-CM | POA: Diagnosis not present

## 2019-06-11 DIAGNOSIS — Z85828 Personal history of other malignant neoplasm of skin: Secondary | ICD-10-CM | POA: Diagnosis not present

## 2019-06-11 DIAGNOSIS — D044 Carcinoma in situ of skin of scalp and neck: Secondary | ICD-10-CM | POA: Diagnosis not present

## 2019-06-11 DIAGNOSIS — D485 Neoplasm of uncertain behavior of skin: Secondary | ICD-10-CM | POA: Diagnosis not present

## 2019-06-14 DIAGNOSIS — C4442 Squamous cell carcinoma of skin of scalp and neck: Secondary | ICD-10-CM | POA: Diagnosis not present

## 2019-06-20 DIAGNOSIS — H01003 Unspecified blepharitis right eye, unspecified eyelid: Secondary | ICD-10-CM | POA: Diagnosis not present

## 2019-06-20 DIAGNOSIS — H2513 Age-related nuclear cataract, bilateral: Secondary | ICD-10-CM | POA: Diagnosis not present

## 2019-06-20 DIAGNOSIS — H25013 Cortical age-related cataract, bilateral: Secondary | ICD-10-CM | POA: Diagnosis not present

## 2019-06-20 DIAGNOSIS — H25043 Posterior subcapsular polar age-related cataract, bilateral: Secondary | ICD-10-CM | POA: Diagnosis not present

## 2019-06-20 DIAGNOSIS — H2511 Age-related nuclear cataract, right eye: Secondary | ICD-10-CM | POA: Diagnosis not present

## 2019-06-20 DIAGNOSIS — J449 Chronic obstructive pulmonary disease, unspecified: Secondary | ICD-10-CM | POA: Diagnosis not present

## 2019-07-06 DIAGNOSIS — J449 Chronic obstructive pulmonary disease, unspecified: Secondary | ICD-10-CM | POA: Diagnosis not present

## 2019-07-10 DIAGNOSIS — H25811 Combined forms of age-related cataract, right eye: Secondary | ICD-10-CM | POA: Diagnosis not present

## 2019-07-10 DIAGNOSIS — H2511 Age-related nuclear cataract, right eye: Secondary | ICD-10-CM | POA: Diagnosis not present

## 2019-07-10 DIAGNOSIS — H2512 Age-related nuclear cataract, left eye: Secondary | ICD-10-CM | POA: Diagnosis not present

## 2019-07-10 DIAGNOSIS — H25042 Posterior subcapsular polar age-related cataract, left eye: Secondary | ICD-10-CM | POA: Diagnosis not present

## 2019-07-10 DIAGNOSIS — H25012 Cortical age-related cataract, left eye: Secondary | ICD-10-CM | POA: Diagnosis not present

## 2019-07-16 DIAGNOSIS — R918 Other nonspecific abnormal finding of lung field: Secondary | ICD-10-CM | POA: Diagnosis not present

## 2019-07-16 DIAGNOSIS — J439 Emphysema, unspecified: Secondary | ICD-10-CM | POA: Diagnosis not present

## 2019-07-16 DIAGNOSIS — I2510 Atherosclerotic heart disease of native coronary artery without angina pectoris: Secondary | ICD-10-CM | POA: Diagnosis not present

## 2019-07-21 ENCOUNTER — Ambulatory Visit: Payer: Medicare HMO | Attending: Internal Medicine

## 2019-07-21 DIAGNOSIS — J449 Chronic obstructive pulmonary disease, unspecified: Secondary | ICD-10-CM | POA: Diagnosis not present

## 2019-07-21 DIAGNOSIS — Z23 Encounter for immunization: Secondary | ICD-10-CM

## 2019-07-21 NOTE — Progress Notes (Signed)
   Covid-19 Vaccination Clinic  Name:  Ernest Singleton    MRN: [id number] DOB: Sep 28, 1940  07/21/2019  Mr. Ernest Singleton was observed post Covid-19 immunization for 30 minutes based on pre-vaccination screening without incidence. He was provided with Vaccine Information Sheet and instruction to access the V-Safe system.   Mr. Ernest Singleton was instructed to call 911 with any severe reactions post vaccine: Marland Kitchen Difficulty breathing  . Swelling of your face and throat  . A fast heartbeat  . A bad rash all over your body  . Dizziness and weakness    Immunizations Administered    Name Date Dose VIS Date Route   Moderna COVID-19 Vaccine 07/21/2019  1:15 PM 0.5 mL 05/08/2019 Intramuscular   Manufacturer: Moderna   Lot: [id number]   [id number]

## 2019-07-25 DIAGNOSIS — R911 Solitary pulmonary nodule: Secondary | ICD-10-CM | POA: Diagnosis not present

## 2019-07-25 DIAGNOSIS — J449 Chronic obstructive pulmonary disease, unspecified: Secondary | ICD-10-CM | POA: Diagnosis not present

## 2019-07-25 DIAGNOSIS — F17210 Nicotine dependence, cigarettes, uncomplicated: Secondary | ICD-10-CM | POA: Diagnosis not present

## 2019-07-25 DIAGNOSIS — J9611 Chronic respiratory failure with hypoxia: Secondary | ICD-10-CM | POA: Diagnosis not present

## 2019-07-30 DIAGNOSIS — J9611 Chronic respiratory failure with hypoxia: Secondary | ICD-10-CM | POA: Diagnosis not present

## 2019-07-30 DIAGNOSIS — F17210 Nicotine dependence, cigarettes, uncomplicated: Secondary | ICD-10-CM | POA: Diagnosis not present

## 2019-07-30 DIAGNOSIS — R05 Cough: Secondary | ICD-10-CM | POA: Diagnosis not present

## 2019-07-30 DIAGNOSIS — J449 Chronic obstructive pulmonary disease, unspecified: Secondary | ICD-10-CM | POA: Diagnosis not present

## 2019-08-01 DIAGNOSIS — J431 Panlobular emphysema: Secondary | ICD-10-CM | POA: Diagnosis not present

## 2019-08-01 DIAGNOSIS — Z299 Encounter for prophylactic measures, unspecified: Secondary | ICD-10-CM | POA: Diagnosis not present

## 2019-08-01 DIAGNOSIS — J449 Chronic obstructive pulmonary disease, unspecified: Secondary | ICD-10-CM | POA: Diagnosis not present

## 2019-08-01 DIAGNOSIS — Z6822 Body mass index (BMI) 22.0-22.9, adult: Secondary | ICD-10-CM | POA: Diagnosis not present

## 2019-08-01 DIAGNOSIS — F322 Major depressive disorder, single episode, severe without psychotic features: Secondary | ICD-10-CM | POA: Diagnosis not present

## 2019-08-01 DIAGNOSIS — N183 Chronic kidney disease, stage 3 unspecified: Secondary | ICD-10-CM | POA: Diagnosis not present

## 2019-08-01 DIAGNOSIS — F17210 Nicotine dependence, cigarettes, uncomplicated: Secondary | ICD-10-CM | POA: Diagnosis not present

## 2019-08-05 DIAGNOSIS — J449 Chronic obstructive pulmonary disease, unspecified: Secondary | ICD-10-CM | POA: Diagnosis not present

## 2019-08-07 DIAGNOSIS — H2512 Age-related nuclear cataract, left eye: Secondary | ICD-10-CM | POA: Diagnosis not present

## 2019-08-14 DIAGNOSIS — I959 Hypotension, unspecified: Secondary | ICD-10-CM | POA: Diagnosis not present

## 2019-08-14 DIAGNOSIS — J209 Acute bronchitis, unspecified: Secondary | ICD-10-CM | POA: Diagnosis not present

## 2019-08-14 DIAGNOSIS — B348 Other viral infections of unspecified site: Secondary | ICD-10-CM | POA: Diagnosis not present

## 2019-08-14 DIAGNOSIS — J9611 Chronic respiratory failure with hypoxia: Secondary | ICD-10-CM | POA: Diagnosis not present

## 2019-08-14 DIAGNOSIS — R0689 Other abnormalities of breathing: Secondary | ICD-10-CM | POA: Diagnosis not present

## 2019-08-14 DIAGNOSIS — R079 Chest pain, unspecified: Secondary | ICD-10-CM | POA: Diagnosis not present

## 2019-08-14 DIAGNOSIS — Z9981 Dependence on supplemental oxygen: Secondary | ICD-10-CM | POA: Diagnosis not present

## 2019-08-14 DIAGNOSIS — J440 Chronic obstructive pulmonary disease with acute lower respiratory infection: Secondary | ICD-10-CM | POA: Diagnosis not present

## 2019-08-14 DIAGNOSIS — Z20822 Contact with and (suspected) exposure to covid-19: Secondary | ICD-10-CM | POA: Diagnosis not present

## 2019-08-14 DIAGNOSIS — Z720 Tobacco use: Secondary | ICD-10-CM | POA: Diagnosis not present

## 2019-08-14 DIAGNOSIS — M1990 Unspecified osteoarthritis, unspecified site: Secondary | ICD-10-CM | POA: Diagnosis not present

## 2019-08-14 DIAGNOSIS — J441 Chronic obstructive pulmonary disease with (acute) exacerbation: Secondary | ICD-10-CM | POA: Diagnosis not present

## 2019-08-14 DIAGNOSIS — R0602 Shortness of breath: Secondary | ICD-10-CM | POA: Diagnosis not present

## 2019-08-14 DIAGNOSIS — N400 Enlarged prostate without lower urinary tract symptoms: Secondary | ICD-10-CM | POA: Diagnosis not present

## 2019-08-14 DIAGNOSIS — R0789 Other chest pain: Secondary | ICD-10-CM | POA: Diagnosis not present

## 2019-08-15 DIAGNOSIS — J441 Chronic obstructive pulmonary disease with (acute) exacerbation: Secondary | ICD-10-CM | POA: Diagnosis not present

## 2019-08-15 DIAGNOSIS — J440 Chronic obstructive pulmonary disease with acute lower respiratory infection: Secondary | ICD-10-CM | POA: Diagnosis not present

## 2019-08-15 DIAGNOSIS — J209 Acute bronchitis, unspecified: Secondary | ICD-10-CM | POA: Diagnosis not present

## 2019-08-15 DIAGNOSIS — R0602 Shortness of breath: Secondary | ICD-10-CM | POA: Diagnosis not present

## 2019-08-16 DIAGNOSIS — J209 Acute bronchitis, unspecified: Secondary | ICD-10-CM | POA: Diagnosis not present

## 2019-08-16 DIAGNOSIS — J440 Chronic obstructive pulmonary disease with acute lower respiratory infection: Secondary | ICD-10-CM | POA: Diagnosis not present

## 2019-08-16 DIAGNOSIS — J441 Chronic obstructive pulmonary disease with (acute) exacerbation: Secondary | ICD-10-CM | POA: Diagnosis not present

## 2019-08-16 DIAGNOSIS — R0602 Shortness of breath: Secondary | ICD-10-CM | POA: Diagnosis not present

## 2019-08-18 ENCOUNTER — Ambulatory Visit: Payer: Medicare HMO | Attending: Internal Medicine

## 2019-08-18 DIAGNOSIS — Z23 Encounter for immunization: Secondary | ICD-10-CM

## 2019-08-18 DIAGNOSIS — J449 Chronic obstructive pulmonary disease, unspecified: Secondary | ICD-10-CM | POA: Diagnosis not present

## 2019-08-18 NOTE — Progress Notes (Signed)
   Covid-19 Vaccination Clinic  Name:  Ernest Singleton    MRN: [id number] DOB: 1941-05-28  08/18/2019  Mr. Ernest Singleton was observed post Covid-19 immunization for 15 minutes without incident. He was provided with Vaccine Information Sheet and instruction to access the V-Safe system.   Mr. Ernest Singleton was instructed to call 911 with any severe reactions post vaccine: Marland Kitchen Difficulty breathing  . Swelling of face and throat  . A fast heartbeat  . A bad rash all over body  . Dizziness and weakness   Immunizations Administered    Name Date Dose VIS Date Route   Moderna COVID-19 Vaccine 08/18/2019 11:38 AM 0.5 mL 05/08/2019 Intramuscular   Manufacturer: Moderna   Lot: [id number]   [id number]

## 2019-08-21 DIAGNOSIS — Z299 Encounter for prophylactic measures, unspecified: Secondary | ICD-10-CM | POA: Diagnosis not present

## 2019-08-21 DIAGNOSIS — J449 Chronic obstructive pulmonary disease, unspecified: Secondary | ICD-10-CM | POA: Diagnosis not present

## 2019-08-21 DIAGNOSIS — J431 Panlobular emphysema: Secondary | ICD-10-CM | POA: Diagnosis not present

## 2019-08-21 DIAGNOSIS — F322 Major depressive disorder, single episode, severe without psychotic features: Secondary | ICD-10-CM | POA: Diagnosis not present

## 2019-08-21 DIAGNOSIS — Z09 Encounter for follow-up examination after completed treatment for conditions other than malignant neoplasm: Secondary | ICD-10-CM | POA: Diagnosis not present

## 2019-08-22 DIAGNOSIS — J449 Chronic obstructive pulmonary disease, unspecified: Secondary | ICD-10-CM | POA: Diagnosis not present

## 2019-08-22 DIAGNOSIS — J9611 Chronic respiratory failure with hypoxia: Secondary | ICD-10-CM | POA: Diagnosis not present

## 2019-08-31 DIAGNOSIS — J449 Chronic obstructive pulmonary disease, unspecified: Secondary | ICD-10-CM | POA: Diagnosis not present

## 2019-09-03 DIAGNOSIS — J449 Chronic obstructive pulmonary disease, unspecified: Secondary | ICD-10-CM | POA: Diagnosis not present

## 2019-09-05 DIAGNOSIS — R0602 Shortness of breath: Secondary | ICD-10-CM | POA: Diagnosis not present

## 2019-09-05 DIAGNOSIS — F17210 Nicotine dependence, cigarettes, uncomplicated: Secondary | ICD-10-CM | POA: Diagnosis not present

## 2019-09-05 DIAGNOSIS — J9611 Chronic respiratory failure with hypoxia: Secondary | ICD-10-CM | POA: Diagnosis not present

## 2019-09-05 DIAGNOSIS — J449 Chronic obstructive pulmonary disease, unspecified: Secondary | ICD-10-CM | POA: Diagnosis not present

## 2019-09-10 DIAGNOSIS — J449 Chronic obstructive pulmonary disease, unspecified: Secondary | ICD-10-CM | POA: Diagnosis not present

## 2019-09-12 DIAGNOSIS — J449 Chronic obstructive pulmonary disease, unspecified: Secondary | ICD-10-CM | POA: Diagnosis not present

## 2019-09-14 DIAGNOSIS — J449 Chronic obstructive pulmonary disease, unspecified: Secondary | ICD-10-CM | POA: Diagnosis not present

## 2019-09-17 DIAGNOSIS — J449 Chronic obstructive pulmonary disease, unspecified: Secondary | ICD-10-CM | POA: Diagnosis not present

## 2019-09-18 DIAGNOSIS — J449 Chronic obstructive pulmonary disease, unspecified: Secondary | ICD-10-CM | POA: Diagnosis not present

## 2019-09-19 DIAGNOSIS — J449 Chronic obstructive pulmonary disease, unspecified: Secondary | ICD-10-CM | POA: Diagnosis not present

## 2019-09-21 DIAGNOSIS — J449 Chronic obstructive pulmonary disease, unspecified: Secondary | ICD-10-CM | POA: Diagnosis not present

## 2019-09-24 DIAGNOSIS — J449 Chronic obstructive pulmonary disease, unspecified: Secondary | ICD-10-CM | POA: Diagnosis not present

## 2019-09-26 DIAGNOSIS — J449 Chronic obstructive pulmonary disease, unspecified: Secondary | ICD-10-CM | POA: Diagnosis not present

## 2019-09-28 DIAGNOSIS — J449 Chronic obstructive pulmonary disease, unspecified: Secondary | ICD-10-CM | POA: Diagnosis not present

## 2019-10-01 DIAGNOSIS — J449 Chronic obstructive pulmonary disease, unspecified: Secondary | ICD-10-CM | POA: Diagnosis not present

## 2019-10-03 DIAGNOSIS — J449 Chronic obstructive pulmonary disease, unspecified: Secondary | ICD-10-CM | POA: Diagnosis not present

## 2019-10-04 DIAGNOSIS — J449 Chronic obstructive pulmonary disease, unspecified: Secondary | ICD-10-CM | POA: Diagnosis not present

## 2019-10-05 DIAGNOSIS — J449 Chronic obstructive pulmonary disease, unspecified: Secondary | ICD-10-CM | POA: Diagnosis not present

## 2019-10-08 DIAGNOSIS — J449 Chronic obstructive pulmonary disease, unspecified: Secondary | ICD-10-CM | POA: Diagnosis not present

## 2019-10-10 DIAGNOSIS — J449 Chronic obstructive pulmonary disease, unspecified: Secondary | ICD-10-CM | POA: Diagnosis not present

## 2019-10-12 DIAGNOSIS — J449 Chronic obstructive pulmonary disease, unspecified: Secondary | ICD-10-CM | POA: Diagnosis not present

## 2019-10-15 DIAGNOSIS — J449 Chronic obstructive pulmonary disease, unspecified: Secondary | ICD-10-CM | POA: Diagnosis not present

## 2019-10-17 DIAGNOSIS — J449 Chronic obstructive pulmonary disease, unspecified: Secondary | ICD-10-CM | POA: Diagnosis not present

## 2019-10-18 DIAGNOSIS — J449 Chronic obstructive pulmonary disease, unspecified: Secondary | ICD-10-CM | POA: Diagnosis not present

## 2019-10-19 DIAGNOSIS — J449 Chronic obstructive pulmonary disease, unspecified: Secondary | ICD-10-CM | POA: Diagnosis not present

## 2019-10-22 DIAGNOSIS — J449 Chronic obstructive pulmonary disease, unspecified: Secondary | ICD-10-CM | POA: Diagnosis not present

## 2019-10-24 DIAGNOSIS — J449 Chronic obstructive pulmonary disease, unspecified: Secondary | ICD-10-CM | POA: Diagnosis not present

## 2019-10-26 DIAGNOSIS — J449 Chronic obstructive pulmonary disease, unspecified: Secondary | ICD-10-CM | POA: Diagnosis not present

## 2019-10-29 DIAGNOSIS — J449 Chronic obstructive pulmonary disease, unspecified: Secondary | ICD-10-CM | POA: Diagnosis not present

## 2019-10-30 DIAGNOSIS — Z299 Encounter for prophylactic measures, unspecified: Secondary | ICD-10-CM | POA: Diagnosis not present

## 2019-10-30 DIAGNOSIS — J431 Panlobular emphysema: Secondary | ICD-10-CM | POA: Diagnosis not present

## 2019-10-30 DIAGNOSIS — J449 Chronic obstructive pulmonary disease, unspecified: Secondary | ICD-10-CM | POA: Diagnosis not present

## 2019-10-30 DIAGNOSIS — J9611 Chronic respiratory failure with hypoxia: Secondary | ICD-10-CM | POA: Diagnosis not present

## 2019-10-30 DIAGNOSIS — F17210 Nicotine dependence, cigarettes, uncomplicated: Secondary | ICD-10-CM | POA: Diagnosis not present

## 2019-10-30 DIAGNOSIS — N183 Chronic kidney disease, stage 3 unspecified: Secondary | ICD-10-CM | POA: Diagnosis not present

## 2019-10-31 DIAGNOSIS — J449 Chronic obstructive pulmonary disease, unspecified: Secondary | ICD-10-CM | POA: Diagnosis not present

## 2019-11-02 DIAGNOSIS — J449 Chronic obstructive pulmonary disease, unspecified: Secondary | ICD-10-CM | POA: Diagnosis not present

## 2019-11-03 DIAGNOSIS — J449 Chronic obstructive pulmonary disease, unspecified: Secondary | ICD-10-CM | POA: Diagnosis not present

## 2019-11-07 DIAGNOSIS — J449 Chronic obstructive pulmonary disease, unspecified: Secondary | ICD-10-CM | POA: Diagnosis not present

## 2019-11-09 DIAGNOSIS — J449 Chronic obstructive pulmonary disease, unspecified: Secondary | ICD-10-CM | POA: Diagnosis not present

## 2019-11-12 DIAGNOSIS — J449 Chronic obstructive pulmonary disease, unspecified: Secondary | ICD-10-CM | POA: Diagnosis not present

## 2019-11-14 DIAGNOSIS — J449 Chronic obstructive pulmonary disease, unspecified: Secondary | ICD-10-CM | POA: Diagnosis not present

## 2019-11-16 DIAGNOSIS — J449 Chronic obstructive pulmonary disease, unspecified: Secondary | ICD-10-CM | POA: Diagnosis not present

## 2019-11-18 DIAGNOSIS — J449 Chronic obstructive pulmonary disease, unspecified: Secondary | ICD-10-CM | POA: Diagnosis not present

## 2019-11-19 DIAGNOSIS — J449 Chronic obstructive pulmonary disease, unspecified: Secondary | ICD-10-CM | POA: Diagnosis not present

## 2019-11-21 DIAGNOSIS — J449 Chronic obstructive pulmonary disease, unspecified: Secondary | ICD-10-CM | POA: Diagnosis not present

## 2019-11-22 DIAGNOSIS — J449 Chronic obstructive pulmonary disease, unspecified: Secondary | ICD-10-CM | POA: Diagnosis not present

## 2019-11-22 DIAGNOSIS — J9611 Chronic respiratory failure with hypoxia: Secondary | ICD-10-CM | POA: Diagnosis not present

## 2019-11-23 DIAGNOSIS — J449 Chronic obstructive pulmonary disease, unspecified: Secondary | ICD-10-CM | POA: Diagnosis not present

## 2019-11-26 DIAGNOSIS — J449 Chronic obstructive pulmonary disease, unspecified: Secondary | ICD-10-CM | POA: Diagnosis not present

## 2019-12-04 DIAGNOSIS — J9611 Chronic respiratory failure with hypoxia: Secondary | ICD-10-CM | POA: Diagnosis not present

## 2019-12-04 DIAGNOSIS — J449 Chronic obstructive pulmonary disease, unspecified: Secondary | ICD-10-CM | POA: Diagnosis not present

## 2019-12-04 DIAGNOSIS — R911 Solitary pulmonary nodule: Secondary | ICD-10-CM | POA: Diagnosis not present

## 2019-12-04 DIAGNOSIS — F17210 Nicotine dependence, cigarettes, uncomplicated: Secondary | ICD-10-CM | POA: Diagnosis not present

## 2019-12-11 DIAGNOSIS — C44311 Basal cell carcinoma of skin of nose: Secondary | ICD-10-CM | POA: Diagnosis not present

## 2019-12-11 DIAGNOSIS — D485 Neoplasm of uncertain behavior of skin: Secondary | ICD-10-CM | POA: Diagnosis not present

## 2019-12-11 DIAGNOSIS — L570 Actinic keratosis: Secondary | ICD-10-CM | POA: Diagnosis not present

## 2019-12-11 DIAGNOSIS — D044 Carcinoma in situ of skin of scalp and neck: Secondary | ICD-10-CM | POA: Diagnosis not present

## 2019-12-18 DIAGNOSIS — J449 Chronic obstructive pulmonary disease, unspecified: Secondary | ICD-10-CM | POA: Diagnosis not present

## 2019-12-24 DIAGNOSIS — J439 Emphysema, unspecified: Secondary | ICD-10-CM | POA: Diagnosis not present

## 2019-12-24 DIAGNOSIS — I2510 Atherosclerotic heart disease of native coronary artery without angina pectoris: Secondary | ICD-10-CM | POA: Diagnosis not present

## 2019-12-24 DIAGNOSIS — R911 Solitary pulmonary nodule: Secondary | ICD-10-CM | POA: Diagnosis not present

## 2019-12-24 DIAGNOSIS — R918 Other nonspecific abnormal finding of lung field: Secondary | ICD-10-CM | POA: Diagnosis not present

## 2019-12-27 DIAGNOSIS — C4442 Squamous cell carcinoma of skin of scalp and neck: Secondary | ICD-10-CM | POA: Diagnosis not present

## 2019-12-27 DIAGNOSIS — C44311 Basal cell carcinoma of skin of nose: Secondary | ICD-10-CM | POA: Diagnosis not present

## 2019-12-29 DIAGNOSIS — H04123 Dry eye syndrome of bilateral lacrimal glands: Secondary | ICD-10-CM | POA: Diagnosis not present

## 2019-12-29 DIAGNOSIS — H40033 Anatomical narrow angle, bilateral: Secondary | ICD-10-CM | POA: Diagnosis not present

## 2020-01-01 DIAGNOSIS — F17210 Nicotine dependence, cigarettes, uncomplicated: Secondary | ICD-10-CM | POA: Diagnosis not present

## 2020-01-01 DIAGNOSIS — J9610 Chronic respiratory failure, unspecified whether with hypoxia or hypercapnia: Secondary | ICD-10-CM | POA: Diagnosis not present

## 2020-01-01 DIAGNOSIS — R911 Solitary pulmonary nodule: Secondary | ICD-10-CM | POA: Diagnosis not present

## 2020-01-01 DIAGNOSIS — J449 Chronic obstructive pulmonary disease, unspecified: Secondary | ICD-10-CM | POA: Diagnosis not present

## 2020-01-03 DIAGNOSIS — J449 Chronic obstructive pulmonary disease, unspecified: Secondary | ICD-10-CM | POA: Diagnosis not present

## 2020-01-18 DIAGNOSIS — J449 Chronic obstructive pulmonary disease, unspecified: Secondary | ICD-10-CM | POA: Diagnosis not present

## 2020-01-22 DIAGNOSIS — R3912 Poor urinary stream: Secondary | ICD-10-CM | POA: Diagnosis not present

## 2020-01-22 DIAGNOSIS — N401 Enlarged prostate with lower urinary tract symptoms: Secondary | ICD-10-CM | POA: Diagnosis not present

## 2020-01-22 DIAGNOSIS — R972 Elevated prostate specific antigen [PSA]: Secondary | ICD-10-CM | POA: Diagnosis not present

## 2020-01-29 DIAGNOSIS — J9611 Chronic respiratory failure with hypoxia: Secondary | ICD-10-CM | POA: Diagnosis not present

## 2020-01-29 DIAGNOSIS — F17210 Nicotine dependence, cigarettes, uncomplicated: Secondary | ICD-10-CM | POA: Diagnosis not present

## 2020-01-29 DIAGNOSIS — R918 Other nonspecific abnormal finding of lung field: Secondary | ICD-10-CM | POA: Diagnosis not present

## 2020-01-29 DIAGNOSIS — J449 Chronic obstructive pulmonary disease, unspecified: Secondary | ICD-10-CM | POA: Diagnosis not present

## 2020-02-01 DIAGNOSIS — J449 Chronic obstructive pulmonary disease, unspecified: Secondary | ICD-10-CM | POA: Diagnosis not present

## 2020-02-01 DIAGNOSIS — J9611 Chronic respiratory failure with hypoxia: Secondary | ICD-10-CM | POA: Diagnosis not present

## 2020-02-07 DIAGNOSIS — N183 Chronic kidney disease, stage 3 unspecified: Secondary | ICD-10-CM | POA: Diagnosis not present

## 2020-02-07 DIAGNOSIS — Z299 Encounter for prophylactic measures, unspecified: Secondary | ICD-10-CM | POA: Diagnosis not present

## 2020-02-07 DIAGNOSIS — J431 Panlobular emphysema: Secondary | ICD-10-CM | POA: Diagnosis not present

## 2020-02-07 DIAGNOSIS — J9611 Chronic respiratory failure with hypoxia: Secondary | ICD-10-CM | POA: Diagnosis not present

## 2020-02-07 DIAGNOSIS — J449 Chronic obstructive pulmonary disease, unspecified: Secondary | ICD-10-CM | POA: Diagnosis not present

## 2020-02-18 DIAGNOSIS — J449 Chronic obstructive pulmonary disease, unspecified: Secondary | ICD-10-CM | POA: Diagnosis not present

## 2020-03-04 DIAGNOSIS — J449 Chronic obstructive pulmonary disease, unspecified: Secondary | ICD-10-CM | POA: Diagnosis not present

## 2020-03-04 DIAGNOSIS — J9611 Chronic respiratory failure with hypoxia: Secondary | ICD-10-CM | POA: Diagnosis not present

## 2020-03-05 DIAGNOSIS — J449 Chronic obstructive pulmonary disease, unspecified: Secondary | ICD-10-CM | POA: Diagnosis not present

## 2020-03-05 DIAGNOSIS — Z23 Encounter for immunization: Secondary | ICD-10-CM | POA: Diagnosis not present

## 2020-03-05 DIAGNOSIS — R911 Solitary pulmonary nodule: Secondary | ICD-10-CM | POA: Diagnosis not present

## 2020-03-05 DIAGNOSIS — J9611 Chronic respiratory failure with hypoxia: Secondary | ICD-10-CM | POA: Diagnosis not present

## 2020-03-05 DIAGNOSIS — F17210 Nicotine dependence, cigarettes, uncomplicated: Secondary | ICD-10-CM | POA: Diagnosis not present

## 2020-03-06 DIAGNOSIS — E7849 Other hyperlipidemia: Secondary | ICD-10-CM | POA: Diagnosis not present

## 2020-03-06 DIAGNOSIS — Z720 Tobacco use: Secondary | ICD-10-CM | POA: Diagnosis not present

## 2020-03-06 DIAGNOSIS — N183 Chronic kidney disease, stage 3 unspecified: Secondary | ICD-10-CM | POA: Diagnosis not present

## 2020-03-06 DIAGNOSIS — J449 Chronic obstructive pulmonary disease, unspecified: Secondary | ICD-10-CM | POA: Diagnosis not present

## 2020-03-19 DIAGNOSIS — J449 Chronic obstructive pulmonary disease, unspecified: Secondary | ICD-10-CM | POA: Diagnosis not present

## 2020-04-04 DIAGNOSIS — J449 Chronic obstructive pulmonary disease, unspecified: Secondary | ICD-10-CM | POA: Diagnosis not present

## 2020-04-04 DIAGNOSIS — J9610 Chronic respiratory failure, unspecified whether with hypoxia or hypercapnia: Secondary | ICD-10-CM | POA: Diagnosis not present

## 2020-04-19 DIAGNOSIS — J449 Chronic obstructive pulmonary disease, unspecified: Secondary | ICD-10-CM | POA: Diagnosis not present

## 2020-05-06 DIAGNOSIS — E7800 Pure hypercholesterolemia, unspecified: Secondary | ICD-10-CM | POA: Diagnosis not present

## 2020-05-06 DIAGNOSIS — M1990 Unspecified osteoarthritis, unspecified site: Secondary | ICD-10-CM | POA: Diagnosis not present

## 2020-06-02 DIAGNOSIS — Z0000 Encounter for general adult medical examination without abnormal findings: Secondary | ICD-10-CM | POA: Diagnosis not present

## 2020-06-02 DIAGNOSIS — Z1339 Encounter for screening examination for other mental health and behavioral disorders: Secondary | ICD-10-CM | POA: Diagnosis not present

## 2020-06-02 DIAGNOSIS — Z7189 Other specified counseling: Secondary | ICD-10-CM | POA: Diagnosis not present

## 2020-06-02 DIAGNOSIS — Z1331 Encounter for screening for depression: Secondary | ICD-10-CM | POA: Diagnosis not present

## 2020-06-02 DIAGNOSIS — J431 Panlobular emphysema: Secondary | ICD-10-CM | POA: Diagnosis not present

## 2020-06-02 DIAGNOSIS — Z79899 Other long term (current) drug therapy: Secondary | ICD-10-CM | POA: Diagnosis not present

## 2020-06-02 DIAGNOSIS — R5383 Other fatigue: Secondary | ICD-10-CM | POA: Diagnosis not present

## 2020-06-02 DIAGNOSIS — F17210 Nicotine dependence, cigarettes, uncomplicated: Secondary | ICD-10-CM | POA: Diagnosis not present

## 2020-06-02 DIAGNOSIS — E7800 Pure hypercholesterolemia, unspecified: Secondary | ICD-10-CM | POA: Diagnosis not present

## 2020-06-06 DIAGNOSIS — J449 Chronic obstructive pulmonary disease, unspecified: Secondary | ICD-10-CM | POA: Diagnosis not present

## 2020-06-06 DIAGNOSIS — E7849 Other hyperlipidemia: Secondary | ICD-10-CM | POA: Diagnosis not present

## 2020-06-06 DIAGNOSIS — N183 Chronic kidney disease, stage 3 unspecified: Secondary | ICD-10-CM | POA: Diagnosis not present

## 2020-06-06 DIAGNOSIS — Z720 Tobacco use: Secondary | ICD-10-CM | POA: Diagnosis not present

## 2020-06-09 DIAGNOSIS — D485 Neoplasm of uncertain behavior of skin: Secondary | ICD-10-CM | POA: Diagnosis not present

## 2020-06-09 DIAGNOSIS — D044 Carcinoma in situ of skin of scalp and neck: Secondary | ICD-10-CM | POA: Diagnosis not present

## 2020-06-09 DIAGNOSIS — L570 Actinic keratosis: Secondary | ICD-10-CM | POA: Diagnosis not present

## 2020-06-09 DIAGNOSIS — C4442 Squamous cell carcinoma of skin of scalp and neck: Secondary | ICD-10-CM | POA: Diagnosis not present

## 2020-06-09 DIAGNOSIS — D034 Melanoma in situ of scalp and neck: Secondary | ICD-10-CM | POA: Diagnosis not present

## 2020-06-12 DIAGNOSIS — R059 Cough, unspecified: Secondary | ICD-10-CM | POA: Diagnosis not present

## 2020-06-12 DIAGNOSIS — J441 Chronic obstructive pulmonary disease with (acute) exacerbation: Secondary | ICD-10-CM | POA: Diagnosis not present

## 2020-06-12 DIAGNOSIS — R911 Solitary pulmonary nodule: Secondary | ICD-10-CM | POA: Diagnosis not present

## 2020-06-12 DIAGNOSIS — J9611 Chronic respiratory failure with hypoxia: Secondary | ICD-10-CM | POA: Diagnosis not present

## 2020-06-12 DIAGNOSIS — F17210 Nicotine dependence, cigarettes, uncomplicated: Secondary | ICD-10-CM | POA: Diagnosis not present

## 2020-06-26 DIAGNOSIS — L988 Other specified disorders of the skin and subcutaneous tissue: Secondary | ICD-10-CM | POA: Diagnosis not present

## 2020-06-26 DIAGNOSIS — D034 Melanoma in situ of scalp and neck: Secondary | ICD-10-CM | POA: Diagnosis not present

## 2020-06-26 DIAGNOSIS — C4442 Squamous cell carcinoma of skin of scalp and neck: Secondary | ICD-10-CM | POA: Diagnosis not present

## 2020-07-07 DIAGNOSIS — Z720 Tobacco use: Secondary | ICD-10-CM | POA: Diagnosis not present

## 2020-07-07 DIAGNOSIS — E7849 Other hyperlipidemia: Secondary | ICD-10-CM | POA: Diagnosis not present

## 2020-07-07 DIAGNOSIS — J449 Chronic obstructive pulmonary disease, unspecified: Secondary | ICD-10-CM | POA: Diagnosis not present

## 2020-07-07 DIAGNOSIS — N183 Chronic kidney disease, stage 3 unspecified: Secondary | ICD-10-CM | POA: Diagnosis not present

## 2020-07-24 DIAGNOSIS — J431 Panlobular emphysema: Secondary | ICD-10-CM | POA: Diagnosis not present

## 2020-07-24 DIAGNOSIS — D849 Immunodeficiency, unspecified: Secondary | ICD-10-CM | POA: Diagnosis not present

## 2020-07-24 DIAGNOSIS — J9611 Chronic respiratory failure with hypoxia: Secondary | ICD-10-CM | POA: Diagnosis not present

## 2020-07-24 DIAGNOSIS — N183 Chronic kidney disease, stage 3 unspecified: Secondary | ICD-10-CM | POA: Diagnosis not present

## 2020-07-24 DIAGNOSIS — J449 Chronic obstructive pulmonary disease, unspecified: Secondary | ICD-10-CM | POA: Diagnosis not present

## 2020-07-24 DIAGNOSIS — F339 Major depressive disorder, recurrent, unspecified: Secondary | ICD-10-CM | POA: Diagnosis not present

## 2020-07-24 DIAGNOSIS — D692 Other nonthrombocytopenic purpura: Secondary | ICD-10-CM | POA: Diagnosis not present

## 2020-07-24 DIAGNOSIS — F17210 Nicotine dependence, cigarettes, uncomplicated: Secondary | ICD-10-CM | POA: Diagnosis not present

## 2020-07-24 DIAGNOSIS — R5383 Other fatigue: Secondary | ICD-10-CM | POA: Diagnosis not present

## 2020-07-24 DIAGNOSIS — Z79899 Other long term (current) drug therapy: Secondary | ICD-10-CM | POA: Diagnosis not present

## 2020-07-24 DIAGNOSIS — Z299 Encounter for prophylactic measures, unspecified: Secondary | ICD-10-CM | POA: Diagnosis not present

## 2020-07-25 DIAGNOSIS — J479 Bronchiectasis, uncomplicated: Secondary | ICD-10-CM | POA: Diagnosis not present

## 2020-07-25 DIAGNOSIS — J8410 Pulmonary fibrosis, unspecified: Secondary | ICD-10-CM | POA: Diagnosis not present

## 2020-07-25 DIAGNOSIS — R918 Other nonspecific abnormal finding of lung field: Secondary | ICD-10-CM | POA: Diagnosis not present

## 2020-07-25 DIAGNOSIS — R911 Solitary pulmonary nodule: Secondary | ICD-10-CM | POA: Diagnosis not present

## 2020-07-25 DIAGNOSIS — J439 Emphysema, unspecified: Secondary | ICD-10-CM | POA: Diagnosis not present

## 2020-09-03 DIAGNOSIS — J449 Chronic obstructive pulmonary disease, unspecified: Secondary | ICD-10-CM | POA: Diagnosis not present

## 2020-09-03 DIAGNOSIS — Z720 Tobacco use: Secondary | ICD-10-CM | POA: Diagnosis not present

## 2020-09-03 DIAGNOSIS — I129 Hypertensive chronic kidney disease with stage 1 through stage 4 chronic kidney disease, or unspecified chronic kidney disease: Secondary | ICD-10-CM | POA: Diagnosis not present

## 2020-09-03 DIAGNOSIS — E7849 Other hyperlipidemia: Secondary | ICD-10-CM | POA: Diagnosis not present

## 2020-09-10 DIAGNOSIS — J479 Bronchiectasis, uncomplicated: Secondary | ICD-10-CM | POA: Diagnosis not present

## 2020-09-10 DIAGNOSIS — F17210 Nicotine dependence, cigarettes, uncomplicated: Secondary | ICD-10-CM | POA: Diagnosis not present

## 2020-09-10 DIAGNOSIS — R911 Solitary pulmonary nodule: Secondary | ICD-10-CM | POA: Diagnosis not present

## 2020-09-10 DIAGNOSIS — J9611 Chronic respiratory failure with hypoxia: Secondary | ICD-10-CM | POA: Diagnosis not present

## 2020-09-10 DIAGNOSIS — J449 Chronic obstructive pulmonary disease, unspecified: Secondary | ICD-10-CM | POA: Diagnosis not present

## 2020-10-03 DIAGNOSIS — J479 Bronchiectasis, uncomplicated: Secondary | ICD-10-CM | POA: Diagnosis not present

## 2020-10-03 DIAGNOSIS — J449 Chronic obstructive pulmonary disease, unspecified: Secondary | ICD-10-CM | POA: Diagnosis not present

## 2020-11-02 DIAGNOSIS — J449 Chronic obstructive pulmonary disease, unspecified: Secondary | ICD-10-CM | POA: Diagnosis not present

## 2020-11-02 DIAGNOSIS — J479 Bronchiectasis, uncomplicated: Secondary | ICD-10-CM | POA: Diagnosis not present

## 2020-12-03 DIAGNOSIS — J479 Bronchiectasis, uncomplicated: Secondary | ICD-10-CM | POA: Diagnosis not present

## 2020-12-03 DIAGNOSIS — J449 Chronic obstructive pulmonary disease, unspecified: Secondary | ICD-10-CM | POA: Diagnosis not present

## 2020-12-04 DIAGNOSIS — I10 Essential (primary) hypertension: Secondary | ICD-10-CM | POA: Diagnosis not present

## 2020-12-04 DIAGNOSIS — E039 Hypothyroidism, unspecified: Secondary | ICD-10-CM | POA: Diagnosis not present

## 2020-12-04 DIAGNOSIS — E7849 Other hyperlipidemia: Secondary | ICD-10-CM | POA: Diagnosis not present

## 2020-12-09 DIAGNOSIS — Z1283 Encounter for screening for malignant neoplasm of skin: Secondary | ICD-10-CM | POA: Diagnosis not present

## 2020-12-09 DIAGNOSIS — L570 Actinic keratosis: Secondary | ICD-10-CM | POA: Diagnosis not present

## 2020-12-09 DIAGNOSIS — Z85820 Personal history of malignant melanoma of skin: Secondary | ICD-10-CM | POA: Diagnosis not present

## 2020-12-11 DIAGNOSIS — J449 Chronic obstructive pulmonary disease, unspecified: Secondary | ICD-10-CM | POA: Diagnosis not present

## 2020-12-11 DIAGNOSIS — J9611 Chronic respiratory failure with hypoxia: Secondary | ICD-10-CM | POA: Diagnosis not present

## 2020-12-11 DIAGNOSIS — F17210 Nicotine dependence, cigarettes, uncomplicated: Secondary | ICD-10-CM | POA: Diagnosis not present

## 2021-01-02 DIAGNOSIS — J479 Bronchiectasis, uncomplicated: Secondary | ICD-10-CM | POA: Diagnosis not present

## 2021-01-02 DIAGNOSIS — J449 Chronic obstructive pulmonary disease, unspecified: Secondary | ICD-10-CM | POA: Diagnosis not present

## 2021-01-04 DIAGNOSIS — E039 Hypothyroidism, unspecified: Secondary | ICD-10-CM | POA: Diagnosis not present

## 2021-01-04 DIAGNOSIS — E7849 Other hyperlipidemia: Secondary | ICD-10-CM | POA: Diagnosis not present

## 2021-01-04 DIAGNOSIS — I10 Essential (primary) hypertension: Secondary | ICD-10-CM | POA: Diagnosis not present

## 2021-01-13 DIAGNOSIS — J449 Chronic obstructive pulmonary disease, unspecified: Secondary | ICD-10-CM | POA: Diagnosis not present

## 2021-01-13 DIAGNOSIS — R918 Other nonspecific abnormal finding of lung field: Secondary | ICD-10-CM | POA: Diagnosis not present

## 2021-01-13 DIAGNOSIS — F17210 Nicotine dependence, cigarettes, uncomplicated: Secondary | ICD-10-CM | POA: Diagnosis not present

## 2021-01-13 DIAGNOSIS — J9611 Chronic respiratory failure with hypoxia: Secondary | ICD-10-CM | POA: Diagnosis not present

## 2021-01-13 DIAGNOSIS — R972 Elevated prostate specific antigen [PSA]: Secondary | ICD-10-CM | POA: Diagnosis not present

## 2021-01-13 DIAGNOSIS — J479 Bronchiectasis, uncomplicated: Secondary | ICD-10-CM | POA: Diagnosis not present

## 2021-01-20 DIAGNOSIS — R3912 Poor urinary stream: Secondary | ICD-10-CM | POA: Diagnosis not present

## 2021-01-20 DIAGNOSIS — R972 Elevated prostate specific antigen [PSA]: Secondary | ICD-10-CM | POA: Diagnosis not present

## 2021-01-20 DIAGNOSIS — N401 Enlarged prostate with lower urinary tract symptoms: Secondary | ICD-10-CM | POA: Diagnosis not present

## 2021-01-27 DIAGNOSIS — J9611 Chronic respiratory failure with hypoxia: Secondary | ICD-10-CM | POA: Diagnosis not present

## 2021-01-27 DIAGNOSIS — Z299 Encounter for prophylactic measures, unspecified: Secondary | ICD-10-CM | POA: Diagnosis not present

## 2021-01-27 DIAGNOSIS — J449 Chronic obstructive pulmonary disease, unspecified: Secondary | ICD-10-CM | POA: Diagnosis not present

## 2021-01-27 DIAGNOSIS — J209 Acute bronchitis, unspecified: Secondary | ICD-10-CM | POA: Diagnosis not present

## 2021-01-27 DIAGNOSIS — J440 Chronic obstructive pulmonary disease with acute lower respiratory infection: Secondary | ICD-10-CM | POA: Diagnosis not present

## 2021-02-02 DIAGNOSIS — J479 Bronchiectasis, uncomplicated: Secondary | ICD-10-CM | POA: Diagnosis not present

## 2021-02-02 DIAGNOSIS — J449 Chronic obstructive pulmonary disease, unspecified: Secondary | ICD-10-CM | POA: Diagnosis not present

## 2021-03-05 DIAGNOSIS — J449 Chronic obstructive pulmonary disease, unspecified: Secondary | ICD-10-CM | POA: Diagnosis not present

## 2021-03-05 DIAGNOSIS — J479 Bronchiectasis, uncomplicated: Secondary | ICD-10-CM | POA: Diagnosis not present

## 2021-04-04 DIAGNOSIS — J449 Chronic obstructive pulmonary disease, unspecified: Secondary | ICD-10-CM | POA: Diagnosis not present

## 2021-04-04 DIAGNOSIS — J479 Bronchiectasis, uncomplicated: Secondary | ICD-10-CM | POA: Diagnosis not present

## 2021-04-06 DIAGNOSIS — E7800 Pure hypercholesterolemia, unspecified: Secondary | ICD-10-CM | POA: Diagnosis not present

## 2021-04-06 DIAGNOSIS — M1990 Unspecified osteoarthritis, unspecified site: Secondary | ICD-10-CM | POA: Diagnosis not present

## 2021-04-15 DIAGNOSIS — J9611 Chronic respiratory failure with hypoxia: Secondary | ICD-10-CM | POA: Diagnosis not present

## 2021-04-15 DIAGNOSIS — J449 Chronic obstructive pulmonary disease, unspecified: Secondary | ICD-10-CM | POA: Diagnosis not present

## 2021-04-15 DIAGNOSIS — J479 Bronchiectasis, uncomplicated: Secondary | ICD-10-CM | POA: Diagnosis not present

## 2021-04-15 DIAGNOSIS — F17210 Nicotine dependence, cigarettes, uncomplicated: Secondary | ICD-10-CM | POA: Diagnosis not present

## 2021-04-15 DIAGNOSIS — R918 Other nonspecific abnormal finding of lung field: Secondary | ICD-10-CM | POA: Diagnosis not present

## 2021-05-05 DIAGNOSIS — J479 Bronchiectasis, uncomplicated: Secondary | ICD-10-CM | POA: Diagnosis not present

## 2021-05-05 DIAGNOSIS — J449 Chronic obstructive pulmonary disease, unspecified: Secondary | ICD-10-CM | POA: Diagnosis not present

## 2021-05-06 DIAGNOSIS — E7800 Pure hypercholesterolemia, unspecified: Secondary | ICD-10-CM | POA: Diagnosis not present

## 2021-05-06 DIAGNOSIS — M1990 Unspecified osteoarthritis, unspecified site: Secondary | ICD-10-CM | POA: Diagnosis not present

## 2021-05-08 ENCOUNTER — Emergency Department (HOSPITAL_COMMUNITY): Payer: Medicare HMO

## 2021-05-08 ENCOUNTER — Other Ambulatory Visit: Payer: Self-pay

## 2021-05-08 ENCOUNTER — Inpatient Hospital Stay (HOSPITAL_COMMUNITY)
Admission: EM | Admit: 2021-05-08 | Discharge: 2021-05-15 | DRG: 871 | Disposition: A | Discharge status: Home / Self Care | Payer: Medicare HMO | Attending: Family Medicine | Admitting: Family Medicine

## 2021-05-08 ENCOUNTER — Encounter (HOSPITAL_COMMUNITY): Payer: Self-pay | Admitting: *Deleted

## 2021-05-08 DIAGNOSIS — Z808 Family history of malignant neoplasm of other organs or systems: Secondary | ICD-10-CM

## 2021-05-08 DIAGNOSIS — Z9049 Acquired absence of other specified parts of digestive tract: Secondary | ICD-10-CM | POA: Diagnosis not present

## 2021-05-08 DIAGNOSIS — Z7951 Long term (current) use of inhaled steroids: Secondary | ICD-10-CM | POA: Diagnosis not present

## 2021-05-08 DIAGNOSIS — J690 Pneumonitis due to inhalation of food and vomit: Secondary | ICD-10-CM | POA: Diagnosis not present

## 2021-05-08 DIAGNOSIS — F17210 Nicotine dependence, cigarettes, uncomplicated: Secondary | ICD-10-CM | POA: Diagnosis not present

## 2021-05-08 DIAGNOSIS — R6889 Other general symptoms and signs: Secondary | ICD-10-CM | POA: Diagnosis not present

## 2021-05-08 DIAGNOSIS — N400 Enlarged prostate without lower urinary tract symptoms: Secondary | ICD-10-CM | POA: Diagnosis present

## 2021-05-08 DIAGNOSIS — J9621 Acute and chronic respiratory failure with hypoxia: Secondary | ICD-10-CM | POA: Diagnosis not present

## 2021-05-08 DIAGNOSIS — J441 Chronic obstructive pulmonary disease with (acute) exacerbation: Secondary | ICD-10-CM | POA: Diagnosis not present

## 2021-05-08 DIAGNOSIS — R0602 Shortness of breath: Secondary | ICD-10-CM | POA: Diagnosis not present

## 2021-05-08 DIAGNOSIS — J9811 Atelectasis: Secondary | ICD-10-CM | POA: Diagnosis present

## 2021-05-08 DIAGNOSIS — Z79899 Other long term (current) drug therapy: Secondary | ICD-10-CM | POA: Diagnosis not present

## 2021-05-08 DIAGNOSIS — Z20822 Contact with and (suspected) exposure to covid-19: Secondary | ICD-10-CM | POA: Diagnosis present

## 2021-05-08 DIAGNOSIS — I2510 Atherosclerotic heart disease of native coronary artery without angina pectoris: Secondary | ICD-10-CM | POA: Diagnosis present

## 2021-05-08 DIAGNOSIS — J9611 Chronic respiratory failure with hypoxia: Secondary | ICD-10-CM

## 2021-05-08 DIAGNOSIS — J449 Chronic obstructive pulmonary disease, unspecified: Secondary | ICD-10-CM | POA: Diagnosis not present

## 2021-05-08 DIAGNOSIS — Z299 Encounter for prophylactic measures, unspecified: Secondary | ICD-10-CM | POA: Diagnosis not present

## 2021-05-08 DIAGNOSIS — J439 Emphysema, unspecified: Secondary | ICD-10-CM | POA: Diagnosis present

## 2021-05-08 DIAGNOSIS — Z888 Allergy status to other drugs, medicaments and biological substances status: Secondary | ICD-10-CM | POA: Diagnosis not present

## 2021-05-08 DIAGNOSIS — J302 Other seasonal allergic rhinitis: Secondary | ICD-10-CM | POA: Diagnosis present

## 2021-05-08 DIAGNOSIS — A419 Sepsis, unspecified organism: Secondary | ICD-10-CM | POA: Diagnosis not present

## 2021-05-08 DIAGNOSIS — Z6821 Body mass index (BMI) 21.0-21.9, adult: Secondary | ICD-10-CM | POA: Diagnosis not present

## 2021-05-08 DIAGNOSIS — E440 Moderate protein-calorie malnutrition: Secondary | ICD-10-CM | POA: Diagnosis present

## 2021-05-08 DIAGNOSIS — Z7982 Long term (current) use of aspirin: Secondary | ICD-10-CM

## 2021-05-08 DIAGNOSIS — R059 Cough, unspecified: Secondary | ICD-10-CM | POA: Diagnosis not present

## 2021-05-08 DIAGNOSIS — Z801 Family history of malignant neoplasm of trachea, bronchus and lung: Secondary | ICD-10-CM | POA: Diagnosis not present

## 2021-05-08 DIAGNOSIS — Z8249 Family history of ischemic heart disease and other diseases of the circulatory system: Secondary | ICD-10-CM

## 2021-05-08 DIAGNOSIS — Z7952 Long term (current) use of systemic steroids: Secondary | ICD-10-CM

## 2021-05-08 DIAGNOSIS — Z8701 Personal history of pneumonia (recurrent): Secondary | ICD-10-CM

## 2021-05-08 DIAGNOSIS — I700 Atherosclerosis of aorta: Secondary | ICD-10-CM | POA: Diagnosis not present

## 2021-05-08 DIAGNOSIS — E785 Hyperlipidemia, unspecified: Secondary | ICD-10-CM | POA: Diagnosis present

## 2021-05-08 DIAGNOSIS — R6510 Systemic inflammatory response syndrome (SIRS) of non-infectious origin without acute organ dysfunction: Secondary | ICD-10-CM

## 2021-05-08 DIAGNOSIS — E860 Dehydration: Secondary | ICD-10-CM | POA: Diagnosis not present

## 2021-05-08 DIAGNOSIS — J470 Bronchiectasis with acute lower respiratory infection: Secondary | ICD-10-CM | POA: Diagnosis not present

## 2021-05-08 DIAGNOSIS — D72829 Elevated white blood cell count, unspecified: Secondary | ICD-10-CM

## 2021-05-08 DIAGNOSIS — R911 Solitary pulmonary nodule: Secondary | ICD-10-CM | POA: Diagnosis not present

## 2021-05-08 LAB — RESP PANEL BY RT-PCR (FLU A&B, COVID) ARPGX2
Influenza A by PCR: NEGATIVE
Influenza B by PCR: NEGATIVE
SARS Coronavirus 2 by RT PCR: NEGATIVE

## 2021-05-08 LAB — CBC WITH DIFFERENTIAL/PLATELET
Abs Immature Granulocytes: 0.04 10*3/uL (ref 0.00–0.07)
Basophils Absolute: 0 10*3/uL (ref 0.0–0.1)
Basophils Relative: 0 %
Eosinophils Absolute: 0 10*3/uL (ref 0.0–0.5)
Eosinophils Relative: 0 %
HCT: 40.6 % (ref 39.0–52.0)
Hemoglobin: 13.1 g/dL (ref 13.0–17.0)
Immature Granulocytes: 0 %
Lymphocytes Relative: 9 %
Lymphs Abs: 1.1 10*3/uL (ref 0.7–4.0)
MCH: 30.6 pg (ref 26.0–34.0)
MCHC: 32.3 g/dL (ref 30.0–36.0)
MCV: 94.9 fL (ref 80.0–100.0)
Monocytes Absolute: 1.3 10*3/uL — ABNORMAL HIGH (ref 0.1–1.0)
Monocytes Relative: 11 %
Neutro Abs: 10.1 10*3/uL — ABNORMAL HIGH (ref 1.7–7.7)
Neutrophils Relative %: 80 %
Platelets: 301 10*3/uL (ref 150–400)
RBC: 4.28 MIL/uL (ref 4.22–5.81)
RDW: 13.3 % (ref 11.5–15.5)
WBC: 12.6 10*3/uL — ABNORMAL HIGH (ref 4.0–10.5)
nRBC: 0 % (ref 0.0–0.2)

## 2021-05-08 LAB — COMPREHENSIVE METABOLIC PANEL
ALT: 19 U/L (ref 0–44)
AST: 18 U/L (ref 15–41)
Albumin: 3.5 g/dL (ref 3.5–5.0)
Alkaline Phosphatase: 103 U/L (ref 38–126)
Anion gap: 7 (ref 5–15)
BUN: 24 mg/dL — ABNORMAL HIGH (ref 8–23)
CO2: 29 mmol/L (ref 22–32)
Calcium: 9 mg/dL (ref 8.9–10.3)
Chloride: 100 mmol/L (ref 98–111)
Creatinine, Ser: 0.92 mg/dL (ref 0.61–1.24)
GFR, Estimated: >60 mL/min (ref >60)
Glucose, Bld: 104 mg/dL — ABNORMAL HIGH (ref 70–99)
Potassium: 4.2 mmol/L (ref 3.5–5.1)
Sodium: 136 mmol/L (ref 135–145)
Total Bilirubin: 0.8 mg/dL (ref 0.3–1.2)
Total Protein: 6.2 g/dL — ABNORMAL LOW (ref 6.5–8.1)

## 2021-05-08 LAB — BLOOD GAS, ARTERIAL
Acid-Base Excess: 2 mmol/L (ref 0.0–2.0)
Bicarbonate: 25.9 mmol/L (ref 20.0–28.0)
Drawn by: 22223
FIO2: 32
O2 Saturation: 92.8 %
Patient temperature: 37
pCO2 arterial: 43.1 mmHg (ref 32.0–48.0)
pH, Arterial: 7.403 (ref 7.350–7.450)
pO2, Arterial: 70.2 mmHg — ABNORMAL LOW (ref 83.0–108.0)

## 2021-05-08 LAB — TROPONIN I (HIGH SENSITIVITY)
Troponin I (High Sensitivity): 5 ng/L (ref <18)
Troponin I (High Sensitivity): 5 ng/L (ref <18)

## 2021-05-08 MED ORDER — IPRATROPIUM-ALBUTEROL 0.5-2.5 (3) MG/3ML IN SOLN
3.0000 mL | Freq: Once | RESPIRATORY_TRACT | Status: AC
  Administered 2021-05-08: 3 mL via RESPIRATORY_TRACT
  Filled 2021-05-08: qty 3

## 2021-05-08 MED ORDER — MAGNESIUM SULFATE 2 GM/50ML IV SOLN
2.0000 g | Freq: Once | INTRAVENOUS | Status: AC
  Administered 2021-05-08: 2 g via INTRAVENOUS
  Filled 2021-05-08: qty 50

## 2021-05-08 MED ORDER — ALBUTEROL (5 MG/ML) CONTINUOUS INHALATION SOLN
2.5000 mg/h | INHALATION_SOLUTION | Freq: Once | RESPIRATORY_TRACT | Status: DC
  Filled 2021-05-08: qty 20

## 2021-05-08 MED ORDER — ALBUTEROL SULFATE (2.5 MG/3ML) 0.083% IN NEBU
INHALATION_SOLUTION | RESPIRATORY_TRACT | Status: AC
  Administered 2021-05-08: 10 mg
  Filled 2021-05-08: qty 12

## 2021-05-08 MED ORDER — IOHEXOL 300 MG/ML  SOLN
100.0000 mL | Freq: Once | INTRAMUSCULAR | Status: AC | PRN
  Administered 2021-05-08: 100 mL via INTRAVENOUS

## 2021-05-08 MED ORDER — METHYLPREDNISOLONE SODIUM SUCC 125 MG IJ SOLR
125.0000 mg | Freq: Once | INTRAMUSCULAR | Status: AC
  Administered 2021-05-08: 125 mg via INTRAVENOUS
  Filled 2021-05-08: qty 2

## 2021-05-08 MED ORDER — AMOXICILLIN-POT CLAVULANATE 875-125 MG PO TABS
1.0000 | ORAL_TABLET | Freq: Once | ORAL | Status: AC
  Administered 2021-05-08: 1 via ORAL
  Filled 2021-05-08: qty 1

## 2021-05-08 NOTE — ED Provider Notes (Signed)
  Face-to-face evaluation   History: He presents for evaluation of shortness of breath.  He is a cigarette smoker with COPD.  He is followed by pulmonary at [facility].  He last saw his pulmonary provider on 04/15/2021.  He is being maintained on Symbicort and Spiriva with as needed albuterol.  He has a history of pulmonary nodules Which he follows up with regular imaging by his pulmonologist. He is here with a family member.  He wants to stay in the hospital.  Physical exam: Awake alert, persistently coughing but not producing sputum that he cannot expectorate.  He evidences increased work of breathing with tachypnea and accessory muscle use.  He has poor airflow bilaterally with generalized wheezing.  He does not have any peripheral edema.  Medical screening examination/treatment/procedure(s) were conducted as a shared visit with non-physician practitioner(s) and myself.  I personally evaluated the patient during the encounter    Daleen Bo, MD 05/09/21 1437

## 2021-05-08 NOTE — ED Triage Notes (Signed)
Shortness of breath x 4 days, getting worse

## 2021-05-08 NOTE — H&P (Signed)
History and Physical  Ernest Singleton ZDG:[id number] DOB: [birth year] DOA: 05/08/2021  Referring physician: Margaretann Loveless, PA-C PCP: Glenda Chroman, MD  Patient coming from: Home  Chief Complaint: Shortness of breath  HPI: Ernest Singleton is a 80 y.o. male with medical history significant for COPD on 3 LPM at home and tobacco abuse who presents to the emergency department due to several weeks of nonproductive cough and shortness of breath which increasingly worsened within the last 4-days.  He complained of chest congestion with difficulty in being able to cough up any sputum and worsening shortness of breath to the extent that he could barely go from 1 room to another in his house without having to stop to rest due to shortness of breath (this was new from baseline).  He denies fever, chills, headache, nausea, vomiting, chest pain.  Patient continues to smoke with last smoking being today (1 cigarette).  ED Course:  In the emergency department, he was tachypneic and tachycardic.  ABG showed hypoxia, work-up in the ED showed normal CBC except for leukocytosis and normal BMP except for elevated BUN at 24.  Troponin x2 was negative.  Influenza A, B, SARS coronavirus 2 was negative. CT chest with contrast was suggestive of lower lobe aspiration with mild right lower lobe pneumonia.  Associated right middle lobe atelectasis/collapse Chest x-ray showed right infrahilar density which could be a mass lesion. Patient was started on Augmentin, breathing treatment was provided, Solu-Medrol 125 mg x 1 and Ativan was given.  Hospitalist was asked to admit patient for further evaluation and management.  Review of Systems: Constitutional: Negative for chills and fever.  HENT: Negative for ear pain and sore throat.   Eyes: Negative for pain and visual disturbance.  Respiratory: Positive for cough and shortness of breath.   Cardiovascular: Negative for chest pain and palpitations.  Gastrointestinal:  Negative for abdominal pain and vomiting.  Endocrine: Negative for polyphagia and polyuria.  Genitourinary: Negative for decreased urine volume, dysuria, enuresis Musculoskeletal: Negative for arthralgias and back pain.  Skin: Negative for color change and rash.  Allergic/Immunologic: Negative for immunocompromised state.  Neurological: Negative for tremors, syncope, speech difficulty Hematological: Does not bruise/bleed easily.  All other systems reviewed and are negative   Past Medical History:  Diagnosis Date   BPH (benign prostatic hyperplasia)    COPD (chronic obstructive pulmonary disease) ([place])    Dr. Melvyn Novas   Hyperlipidemia    Osteoarthritis    Seasonal allergies    Past Surgical History:  Procedure Laterality Date   CHOLECYSTECTOMY     KNEE ARTHROSCOPY      Social History:  reports that he has been smoking cigarettes. He has a 54.00 pack-year smoking history. He has never used smokeless tobacco. He reports that he does not drink alcohol and does not use drugs.   Allergies  Allergen Reactions   Requip [Ropinirole Hcl]     Dizziness     Family History  Problem Relation Age of Onset   Lung cancer Paternal Uncle        ? if smoker or not   Brain cancer Paternal Uncle    Heart disease Father       Prior to Admission medications   Medication Sig Start Date End Date Taking? Authorizing Provider  celecoxib (CELEBREX) 200 MG capsule TAKE ONE (1) CAPSULE EACH DAY 06/09/16  Yes Terald Sleeper, PA-C  cetirizine (ZYRTEC) 10 MG chewable tablet Chew 10 mg by mouth daily.  Yes [provider]  Cholecalciferol (VITAMIN D-3) 1000 UNITS CAPS Take 1,000 Units by mouth daily.   Yes [provider]  predniSONE (DELTASONE) 10 MG tablet Take  4 each am x 2 days,   2 each am x 2 days,  1 each am x 2 days and stop 12/25/14  Yes Tanda Rockers, MD  SYMBICORT 160-4.5 MCG/ACT inhaler INHALE 2 PUFFS EVERY 12 HOURS 01/30/14  Yes Tanda Rockers, MD  Tamsulosin HCl  (FLOMAX) 0.4 MG CAPS Take 0.4 mg by mouth daily.   Yes [provider]  VENTOLIN HFA 108 (90 Base) MCG/ACT inhaler USE 2 PUFFS EVERY 4 TO 6 HOURS AS NEEDED 06/09/16  Yes Terald Sleeper, PA-C  aspirin 81 MG tablet Take 81 mg by mouth daily.    [provider]  Azelastine-Fluticasone 137-50 MCG/ACT SUSP Place 2 puffs into the nose as needed. 06/12/14 10/14/15  Tanda Rockers, MD  varenicline (CHANTIX PAK) 0.5 MG X 11 & 1 MG X 42 tablet Take one 0.5 mg tablet by mouth once daily for 3 days, then increase to one 0.5 mg tablet twice daily for 4 days, then increase to one 1 mg tablet twice daily. 12/25/14   Tanda Rockers, MD    Physical Exam: BP 128/70 (BP Location: Left Arm)   Pulse 95   Temp 98.6 F (37 C) (Oral)   Resp (!) 26   Ht 5\' 9"  (1.753 m)   Wt 77.7 kg   SpO2 96%   BMI 25.30 kg/m   General: 80 y.o. year-old male well developed well nourished in no acute distress.  Alert and oriented x3. HEENT: Dry mucous membrane.  NCAT, EOMI Neck: Supple, trachea medial Cardiovascular: Regular rate and rhythm with no rubs or gallops.  No thyromegaly or JVD noted.  No lower extremity edema. 2/4 pulses in all 4 extremities. Respiratory: Diffuse rhonchi and scattered wheezes on auscultation.   Abdomen: Soft, nontender nondistended with normal bowel sounds x4 quadrants. Muskuloskeletal: No cyanosis, clubbing or edema noted bilaterally Neuro: CN II-XII intact, strength 5/5 x 4, sensation, reflexes intact Skin: No ulcerative lesions noted or rashes Psychiatry: Judgement and insight appear normal. Mood is appropriate for condition and setting          Labs on Admission:  Basic Metabolic Panel: Recent Labs  Lab 05/08/21 1901  NA 136  K 4.2  CL 100  CO2 29  GLUCOSE 104*  BUN 24*  CREATININE 0.92  CALCIUM 9.0   Liver Function Tests: Recent Labs  Lab 05/08/21 1901  AST 18  ALT 19  ALKPHOS 103  BILITOT 0.8  PROT 6.2*  ALBUMIN 3.5   No results for input(s): LIPASE,  AMYLASE in the last 168 hours. No results for input(s): AMMONIA in the last 168 hours. CBC: Recent Labs  Lab 05/08/21 1901  WBC 12.6*  NEUTROABS 10.1*  HGB 13.1  HCT 40.6  MCV 94.9  PLT 301   Cardiac Enzymes: No results for input(s): CKTOTAL, CKMB, CKMBINDEX, TROPONINI in the last 168 hours.  BNP (last 3 results) No results for input(s): BNP in the last 8760 hours.  ProBNP (last 3 results) No results for input(s): PROBNP in the last 8760 hours.  CBG: No results for input(s): GLUCAP in the last 168 hours.  Radiological Exams on Admission: CT Chest W Contrast  Result Date: 05/09/2021 CLINICAL DATA:  Abnormal chest radiograph, shortness of breath EXAM: CT CHEST WITH CONTRAST TECHNIQUE: Multidetector CT imaging of the chest was performed  during intravenous contrast administration. CONTRAST:  131mL OMNIPAQUE IOHEXOL 300 MG/ML  SOLN COMPARISON:  Chest radiograph dated 05/08/2021 FINDINGS: Cardiovascular: The heart is normal in size. No pericardial effusion. No evidence of thoracic aortic aneurysm. Atherosclerotic calcifications of the aortic arch. Coronary atherosclerosis of the LAD and right coronary artery. Mediastinum/Nodes: No suspicious mediastinal lymphadenopathy. 7 mm left thyroid nodule, benign. Not clinically significant; no follow-up imaging recommended (ref: J Am Coll Radiol. 2015 Feb;12(2): 143-50). Lungs/Pleura: Moderate centrilobular and paraseptal emphysematous changes, upper lung predominant. Right middle lobe scarring/atelectasis with collapse. Mild patchy right lower lobe opacity (series 4/image 139), suggesting mild infection/pneumonia. Associated peribronchial thickening/mucous plugging in the bilateral lower lobe airways, suggesting aspiration is the underlying etiology. No suspicious pulmonary nodules. No pleural effusion or pneumothorax. Upper Abdomen: Visualized upper abdomen is grossly unremarkable, noting motion degradation. Musculoskeletal: Mild degenerative changes  of the mid/lower thoracic spine. IMPRESSION: Suspected lower lobe aspiration with mild right lower lobe pneumonia. Associated right middle lobe atelectasis/collapse. Aortic Atherosclerosis (ICD10-I70.0) and Emphysema (ICD10-J43.9). Electronically Signed   By: Julian Hy M.D.   On: 05/09/2021 00:07   DG Chest Portable 1 View  Result Date: 05/08/2021 CLINICAL DATA:  Cough and short of breath EXAM: PORTABLE CHEST 1 VIEW COMPARISON:  12/25/2014 FINDINGS: Heart size and vascularity normal.  Negative for heart failure. COPD with pulmonary hyperinflation Abnormal density right inferior hilar region. This may be within the right middle lobe or right lower lobe. Possible atelectasis or mass lesion. No pleural effusion. IMPRESSION: Right infrahilar density which could be a mass lesion. Recommend follow-up CT chest with contrast COPD Electronically Signed   By: Franchot Gallo M.D.   On: 05/08/2021 18:59    EKG: I independently viewed the EKG done and my findings are as followed: Normal sinus rhythm at a rate of 81 bpm with multiple PVCs and APCs  Assessment/Plan Present on Admission:  Acute exacerbation of chronic obstructive pulmonary disease (COPD) ([place])  Cigarette smoker  Principal Problem:   Acute exacerbation of chronic obstructive pulmonary disease (COPD) ([place]) Active Problems:   Cigarette smoker   Aspiration pneumonia ([facility])   BPH (benign prostatic hyperplasia)   Chronic respiratory failure with hypoxia ([facility])   Leukocytosis   Dehydration  Aspiration pneumonia in the setting of chronic respiratory failure with hypoxia due to acute exacerbation of COPD CT chest with contrast was suggestive of lower lobe aspiration with mild right lower lobe pneumonia.  Associated right middle lobe atelectasis/collapse. Associated peribronchial thickening/mucous plugging in the bilateral lower lobe airways, suggesting aspiration is the underlying etiology. PORT/PSI of  101 points  indicating 8.2-9.3%  mortality Continue Mucinex, Unasyn, duo nebs, Solu-Medrol Continue Protonix to prevent steroid-induced ulcer Continue  incentive spirometry, flutter valve  Blood culture, urine Legionella, strep pneumo, procalcitonin and sputum culture pending Continue supplemental oxygen to maintain O2 sat > 92%   SIRS possibly secondary to above Patient presents with leukocytosis and tachypnea in the setting of aspiration pneumonia. Continue treatment as described above  Dehydration Continue IV hydration  Tobacco use Patient was counseled on tobacco abuse cessation Continue nicotine patch  BPH Continue Flomax  DVT prophylaxis: Lovenox  Code Status: Full code  Family Communication: None at bedside  Disposition Plan:  Patient is from:                        home Anticipated DC to:                   SNF or family  members home Anticipated DC date:               2-3 days Anticipated DC barriers:          Patient requires inpatient management due to aspiration pneumonia requiring IV antibiotics  Consults called: None  Admission status: Observation    Bernadette Hoit MD [facility]  05/09/2021, 1:53 AM

## 2021-05-08 NOTE — ED Provider Notes (Signed)
[facility] Provider Note   CSN: [id number] Arrival date & time: 05/08/21  1643     History Chief Complaint  Patient presents with   Shortness of Breath    Ernest Singleton is a 80 y.o. male with history of COPD normally on 3 L at home oxygen who presents to the emergency department with shortness of breath that has been acutely worsening over the last 4 days.  Patient has been complaining of sore throat and general malaise as well.  He reports associated chest pain with radiation into the left arm, generalized abdominal pain, and diarrhea that has been going on for last couple of weeks.  He denies any nausea, vomiting, urinary complaints, fever, chills.  He states that this feels similar to when he had pneumonia in the past.  He has not taken anything for his symptoms.  He rates his chest pain moderate in severity.   Shortness of Breath     Past Medical History:  Diagnosis Date   BPH (benign prostatic hyperplasia)    COPD (chronic obstructive pulmonary disease) ([facility])    Dr. Melvyn Novas   Hyperlipidemia    Osteoarthritis    Seasonal allergies     Patient Active Problem List   Diagnosis Date Noted   Multiple pulmonary nodules 01/28/2014   Syncope 09/29/2012   Chronic rhinitis 02/09/2012   COPD  GOLD II with reversibility  12/27/2011   Cigarette smoker 12/27/2011    Past Surgical History:  Procedure Laterality Date   CHOLECYSTECTOMY     KNEE ARTHROSCOPY         Family History  Problem Relation Age of Onset   Lung cancer Paternal Uncle        ? if smoker or not   Brain cancer Paternal Uncle    Heart disease Father     Social History   Tobacco Use   Smoking status: Every Day    Packs/day: 1.00    Years: 54.00    Pack years: 54.00    Types: Cigarettes    Last attempt to quit: 02/05/2014    Years since quitting: 7.2   Smokeless tobacco: Never  Substance Use Topics   Alcohol use: No    Alcohol/week: 0.0 standard drinks   Drug use: No     Home Medications Prior to Admission medications   Medication Sig Start Date End Date Taking? Authorizing Provider  celecoxib (CELEBREX) 200 MG capsule TAKE ONE (1) CAPSULE EACH DAY 06/09/16  Yes Terald Sleeper, PA-C  cetirizine (ZYRTEC) 10 MG chewable tablet Chew 10 mg by mouth daily.    Yes [provider]  Cholecalciferol (VITAMIN D-3) 1000 UNITS CAPS Take 1,000 Units by mouth daily.   Yes [provider]  predniSONE (DELTASONE) 10 MG tablet Take  4 each am x 2 days,   2 each am x 2 days,  1 each am x 2 days and stop 12/25/14  Yes Tanda Rockers, MD  SYMBICORT 160-4.5 MCG/ACT inhaler INHALE 2 PUFFS EVERY 12 HOURS 01/30/14  Yes Tanda Rockers, MD  Tamsulosin HCl (FLOMAX) 0.4 MG CAPS Take 0.4 mg by mouth daily.   Yes [provider]  VENTOLIN HFA 108 (90 Base) MCG/ACT inhaler USE 2 PUFFS EVERY 4 TO 6 HOURS AS NEEDED 06/09/16  Yes Terald Sleeper, PA-C  aspirin 81 MG tablet Take 81 mg by mouth daily.    [provider]  Azelastine-Fluticasone 137-50 MCG/ACT SUSP Place 2 puffs into the nose as  needed. 06/12/14 10/14/15  Tanda Rockers, MD  varenicline (CHANTIX PAK) 0.5 MG X 11 & 1 MG X 42 tablet Take one 0.5 mg tablet by mouth once daily for 3 days, then increase to one 0.5 mg tablet twice daily for 4 days, then increase to one 1 mg tablet twice daily. 12/25/14   Tanda Rockers, MD    Allergies    Requip [ropinirole hcl]  Review of Systems   Review of Systems  Respiratory:  Positive for shortness of breath.   All other systems reviewed and are negative.  Physical Exam Updated Vital Signs BP (!) 129/57   Pulse 90   Temp 98.6 F (37 C) (Oral)   Resp (!) 30   Ht 5\' 9"  (1.753 m)   Wt 77.7 kg   SpO2 93%   BMI 25.30 kg/m   Physical Exam Vitals and nursing note reviewed.  Constitutional:      Appearance: Normal appearance.     Comments: Mild distress.  HENT:     Head: Normocephalic and atraumatic.  Eyes:     General:        Right eye: No  discharge.        Left eye: No discharge.  Cardiovascular:     Comments: Regular rate and rhythm.  S1/S2 are distinct without any evidence of murmur, rubs, or gallops.  Radial pulses are 2+ bilaterally.  Dorsalis pedis pulses are 2+ bilaterally.  No evidence of pedal edema. Pulmonary:     Comments: Diffuse rhonchi heard throughout all lung fields.  He is tachypneic.  Moderate respiratory distress. Chest:     Comments: Chest wall is nontender to palpation.  Abdominal:     General: Abdomen is flat. Bowel sounds are normal. There is no distension.     Tenderness: There is no abdominal tenderness. There is no guarding or rebound.  Musculoskeletal:        General: Normal range of motion.     Cervical back: Neck supple.  Skin:    General: Skin is warm and dry.     Findings: No rash.  Neurological:     General: No focal deficit present.     Mental Status: He is alert.  Psychiatric:        Mood and Affect: Mood normal.        Behavior: Behavior normal.    ED Results / Procedures / Treatments   Labs (all labs ordered are listed, but only abnormal results are displayed) Labs Reviewed  COMPREHENSIVE METABOLIC PANEL - Abnormal; Notable for the following components:      Result Value   Glucose, Bld 104 (*)    BUN 24 (*)    Total Protein 6.2 (*)    All other components within normal limits  CBC WITH DIFFERENTIAL/PLATELET - Abnormal; Notable for the following components:   WBC 12.6 (*)    Neutro Abs 10.1 (*)    Monocytes Absolute 1.3 (*)    All other components within normal limits  BLOOD GAS, ARTERIAL - Abnormal; Notable for the following components:   pO2, Arterial 70.2 (*)    All other components within normal limits  RESP PANEL BY RT-PCR (FLU A&B, COVID) [id number]  TROPONIN I (HIGH SENSITIVITY)  TROPONIN I (HIGH SENSITIVITY)    EKG EKG Interpretation  Date/Time:  Friday May 08 2021 20:38:39 EST Ventricular Rate:  83 PR Interval:  116 QRS Duration: 101 QT  Interval:  396 QTC Calculation: 466 R Axis:   56 Text Interpretation:  Sinus rhythm Multiple premature complexes, vent & supraven Borderline short PR interval Borderline T wave abnormalities since last tracing no significant change Confirmed by Daleen Bo [phone]) on 05/08/2021 8:42:15 PM  Radiology DG Chest Portable 1 View  Result Date: 05/08/2021 CLINICAL DATA:  Cough and short of breath EXAM: PORTABLE CHEST 1 VIEW COMPARISON:  12/25/2014 FINDINGS: Heart size and vascularity normal.  Negative for heart failure. COPD with pulmonary hyperinflation Abnormal density right inferior hilar region. This may be within the right middle lobe or right lower lobe. Possible atelectasis or mass lesion. No pleural effusion. IMPRESSION: Right infrahilar density which could be a mass lesion. Recommend follow-up CT chest with contrast COPD Electronically Signed   By: Franchot Gallo M.D.   On: 05/08/2021 18:59    Procedures Procedures   Medications Ordered in ED Medications  albuterol (PROVENTIL,VENTOLIN) solution continuous neb ( Nebulization Canceled Entry 05/08/21 2129)  ipratropium-albuterol (DUONEB) 0.5-2.5 (3) MG/3ML nebulizer solution 3 mL (3 mLs Nebulization Given 05/08/21 2015)  methylPREDNISolone sodium succinate (SOLU-MEDROL) 125 mg/2 mL injection 125 mg (125 mg Intravenous Given 05/08/21 2011)  magnesium sulfate IVPB 2 g 50 mL (0 g Intravenous Stopped 05/08/21 2219)  amoxicillin-clavulanate (AUGMENTIN) 875-125 MG per tablet 1 tablet (1 tablet Oral Given 05/08/21 2113)  albuterol (PROVENTIL) (2.5 MG/3ML) 0.083% nebulizer solution (10 mg  Given 05/08/21 2129)  iohexol (OMNIPAQUE) 300 MG/ML solution 100 mL (100 mLs Intravenous Contrast Given 05/08/21 2346)    ED Course  I have reviewed the triage vital signs and the nursing notes.  Pertinent labs & imaging results that were available during my care of the patient were reviewed by me and considered in my medical decision making (see chart for  details).  Clinical Course as of 05/09/21 0007  Fri May 08, 2021  2052 I discussed this case with my attending physician who cosigned this note including patient's presenting symptoms, physical exam, and planned diagnostics and interventions. Attending physician stated agreement with plan or made changes to plan which were implemented.   Attending physician assessed patient at bedside.   [CF]  2327 I spoke with Dr. Josephine Cables who recommends getting an ABG and once it is resulted he will admit the patient.  [CF]  Sat May 09, 2021  0004 I notified Dr. Josephine Cables about the ABG results. [CF]    Clinical Course User Index [CF] Cherrie Gauze   MDM Rules/Calculators/A&P                          Ernest Singleton is a 80 y.o. male who presents the emergency department with shortness of breath that is worse than his baseline.  Concerned that he has possible pneumonia or viral illness that is triggered a COPD exacerbation.  He is in mild respiratory distress.  I will get basic labs, chest x-ray, respiratory panel, and give him nebs.  Will also get troponin to evaluate for possible ACS.  CBC revealed leukocytosis.  CMP showed elevated BUN and slight elevated glucose but was otherwise normal.  Initial and delta troponin were negative. Respiratory panel was negative for COVID and flu.  Chest x-ray showed right infrahilar density which could represent a mass lesion.  I ordered a CT chest with contrast to further evaluate but patient is still having an increased work of breathing and I do not feel that he is safe for the CT scanner at this moment.  I have given the patient magnesium sulfate and placed him  on a continuous nebulizer treatment and patient is still quite rhonchorous and has a increased work of breathing.  Given the clinical scenario, I believe he would benefit from further evaluation in the hospital. He is admitted to Dr. Josephine Cables with the hospitalist service.    Final Clinical  Impression(s) / ED Diagnoses Final diagnoses:  COPD exacerbation [facility])    Rx / DC Orders ED Discharge Orders     None        Hendricks Limes, [place] 05/09/21 0007    Daleen Bo, MD 05/09/21 [phone]

## 2021-05-09 DIAGNOSIS — F17210 Nicotine dependence, cigarettes, uncomplicated: Secondary | ICD-10-CM | POA: Diagnosis present

## 2021-05-09 DIAGNOSIS — Z8249 Family history of ischemic heart disease and other diseases of the circulatory system: Secondary | ICD-10-CM | POA: Diagnosis not present

## 2021-05-09 DIAGNOSIS — I2510 Atherosclerotic heart disease of native coronary artery without angina pectoris: Secondary | ICD-10-CM | POA: Diagnosis present

## 2021-05-09 DIAGNOSIS — Z7982 Long term (current) use of aspirin: Secondary | ICD-10-CM | POA: Diagnosis not present

## 2021-05-09 DIAGNOSIS — Z801 Family history of malignant neoplasm of trachea, bronchus and lung: Secondary | ICD-10-CM | POA: Diagnosis not present

## 2021-05-09 DIAGNOSIS — E860 Dehydration: Secondary | ICD-10-CM

## 2021-05-09 DIAGNOSIS — Z8701 Personal history of pneumonia (recurrent): Secondary | ICD-10-CM | POA: Diagnosis not present

## 2021-05-09 DIAGNOSIS — Z20822 Contact with and (suspected) exposure to covid-19: Secondary | ICD-10-CM | POA: Diagnosis present

## 2021-05-09 DIAGNOSIS — J441 Chronic obstructive pulmonary disease with (acute) exacerbation: Secondary | ICD-10-CM | POA: Diagnosis present

## 2021-05-09 DIAGNOSIS — Z7951 Long term (current) use of inhaled steroids: Secondary | ICD-10-CM | POA: Diagnosis not present

## 2021-05-09 DIAGNOSIS — D72829 Elevated white blood cell count, unspecified: Secondary | ICD-10-CM | POA: Diagnosis not present

## 2021-05-09 DIAGNOSIS — N400 Enlarged prostate without lower urinary tract symptoms: Secondary | ICD-10-CM

## 2021-05-09 DIAGNOSIS — Z7952 Long term (current) use of systemic steroids: Secondary | ICD-10-CM | POA: Diagnosis not present

## 2021-05-09 DIAGNOSIS — J439 Emphysema, unspecified: Secondary | ICD-10-CM | POA: Diagnosis present

## 2021-05-09 DIAGNOSIS — J9611 Chronic respiratory failure with hypoxia: Secondary | ICD-10-CM

## 2021-05-09 DIAGNOSIS — Z808 Family history of malignant neoplasm of other organs or systems: Secondary | ICD-10-CM | POA: Diagnosis not present

## 2021-05-09 DIAGNOSIS — Z888 Allergy status to other drugs, medicaments and biological substances status: Secondary | ICD-10-CM | POA: Diagnosis not present

## 2021-05-09 DIAGNOSIS — R6510 Systemic inflammatory response syndrome (SIRS) of non-infectious origin without acute organ dysfunction: Secondary | ICD-10-CM

## 2021-05-09 DIAGNOSIS — E785 Hyperlipidemia, unspecified: Secondary | ICD-10-CM | POA: Diagnosis present

## 2021-05-09 DIAGNOSIS — E440 Moderate protein-calorie malnutrition: Secondary | ICD-10-CM | POA: Diagnosis present

## 2021-05-09 DIAGNOSIS — J302 Other seasonal allergic rhinitis: Secondary | ICD-10-CM | POA: Diagnosis present

## 2021-05-09 DIAGNOSIS — Z9049 Acquired absence of other specified parts of digestive tract: Secondary | ICD-10-CM | POA: Diagnosis not present

## 2021-05-09 DIAGNOSIS — J690 Pneumonitis due to inhalation of food and vomit: Secondary | ICD-10-CM

## 2021-05-09 DIAGNOSIS — J9621 Acute and chronic respiratory failure with hypoxia: Secondary | ICD-10-CM | POA: Diagnosis not present

## 2021-05-09 DIAGNOSIS — J9811 Atelectasis: Secondary | ICD-10-CM | POA: Diagnosis present

## 2021-05-09 DIAGNOSIS — Z79899 Other long term (current) drug therapy: Secondary | ICD-10-CM | POA: Diagnosis not present

## 2021-05-09 DIAGNOSIS — R0602 Shortness of breath: Secondary | ICD-10-CM | POA: Diagnosis present

## 2021-05-09 DIAGNOSIS — A419 Sepsis, unspecified organism: Secondary | ICD-10-CM | POA: Diagnosis present

## 2021-05-09 DIAGNOSIS — J470 Bronchiectasis with acute lower respiratory infection: Secondary | ICD-10-CM | POA: Diagnosis present

## 2021-05-09 LAB — COMPREHENSIVE METABOLIC PANEL
ALT: 22 U/L (ref 0–44)
AST: 23 U/L (ref 15–41)
Albumin: 3.3 g/dL — ABNORMAL LOW (ref 3.5–5.0)
Alkaline Phosphatase: 104 U/L (ref 38–126)
Anion gap: 7 (ref 5–15)
BUN: 20 mg/dL (ref 8–23)
CO2: 24 mmol/L (ref 22–32)
Calcium: 8.9 mg/dL (ref 8.9–10.3)
Chloride: 103 mmol/L (ref 98–111)
Creatinine, Ser: 0.83 mg/dL (ref 0.61–1.24)
GFR, Estimated: >60 mL/min (ref >60)
Glucose, Bld: 202 mg/dL — ABNORMAL HIGH (ref 70–99)
Potassium: 4 mmol/L (ref 3.5–5.1)
Sodium: 134 mmol/L — ABNORMAL LOW (ref 135–145)
Total Bilirubin: 0.8 mg/dL (ref 0.3–1.2)
Total Protein: 6.1 g/dL — ABNORMAL LOW (ref 6.5–8.1)

## 2021-05-09 LAB — CBC
HCT: 39.1 % (ref 39.0–52.0)
Hemoglobin: 12.7 g/dL — ABNORMAL LOW (ref 13.0–17.0)
MCH: 30.4 pg (ref 26.0–34.0)
MCHC: 32.5 g/dL (ref 30.0–36.0)
MCV: 93.5 fL (ref 80.0–100.0)
Platelets: 288 10*3/uL (ref 150–400)
RBC: 4.18 MIL/uL — ABNORMAL LOW (ref 4.22–5.81)
RDW: 13.2 % (ref 11.5–15.5)
WBC: 15.2 10*3/uL — ABNORMAL HIGH (ref 4.0–10.5)
nRBC: 0 % (ref 0.0–0.2)

## 2021-05-09 LAB — MAGNESIUM: Magnesium: 2.3 mg/dL (ref 1.7–2.4)

## 2021-05-09 LAB — PHOSPHORUS: Phosphorus: 2.6 mg/dL (ref 2.5–4.6)

## 2021-05-09 LAB — PROCALCITONIN: Procalcitonin: <0.1 ng/mL

## 2021-05-09 LAB — APTT: aPTT: 32 seconds (ref 24–36)

## 2021-05-09 MED ORDER — MOMETASONE FURO-FORMOTEROL FUM 200-5 MCG/ACT IN AERO
2.0000 | INHALATION_SPRAY | Freq: Two times a day (BID) | RESPIRATORY_TRACT | Status: DC
  Administered 2021-05-09: 2 via RESPIRATORY_TRACT
  Filled 2021-05-09 (×2): qty 8.8

## 2021-05-09 MED ORDER — METHYLPREDNISOLONE SODIUM SUCC 40 MG IJ SOLR
40.0000 mg | Freq: Two times a day (BID) | INTRAMUSCULAR | Status: AC
  Administered 2021-05-09 (×2): 40 mg via INTRAVENOUS
  Filled 2021-05-09 (×2): qty 1

## 2021-05-09 MED ORDER — SODIUM CHLORIDE 0.9 % IV SOLN
3.0000 g | Freq: Four times a day (QID) | INTRAVENOUS | Status: DC
  Administered 2021-05-09 – 2021-05-11 (×11): 3 g via INTRAVENOUS
  Filled 2021-05-09 (×4): qty 8
  Filled 2021-05-09: qty 3
  Filled 2021-05-09 (×8): qty 8
  Filled 2021-05-09 (×2): qty 3
  Filled 2021-05-09: qty 8

## 2021-05-09 MED ORDER — ACETAMINOPHEN 325 MG PO TABS
650.0000 mg | ORAL_TABLET | Freq: Four times a day (QID) | ORAL | Status: DC | PRN
  Administered 2021-05-10 – 2021-05-14 (×5): 650 mg via ORAL
  Filled 2021-05-09 (×4): qty 2

## 2021-05-09 MED ORDER — SODIUM CHLORIDE 0.9 % IV SOLN: Freq: Once | INTRAVENOUS | Status: AC

## 2021-05-09 MED ORDER — IPRATROPIUM-ALBUTEROL 0.5-2.5 (3) MG/3ML IN SOLN
3.0000 mL | Freq: Four times a day (QID) | RESPIRATORY_TRACT | Status: DC
  Administered 2021-05-09 – 2021-05-11 (×10): 3 mL via RESPIRATORY_TRACT
  Filled 2021-05-09 (×10): qty 3

## 2021-05-09 MED ORDER — GUAIFENESIN-DM 100-10 MG/5ML PO SYRP
5.0000 mL | ORAL_SOLUTION | ORAL | Status: DC | PRN
  Administered 2021-05-09 – 2021-05-14 (×3): 5 mL via ORAL
  Filled 2021-05-09 (×3): qty 5

## 2021-05-09 MED ORDER — BUDESONIDE 0.25 MG/2ML IN SUSP
0.2500 mg | Freq: Two times a day (BID) | RESPIRATORY_TRACT | Status: DC
  Administered 2021-05-09 – 2021-05-15 (×12): 0.25 mg via RESPIRATORY_TRACT
  Filled 2021-05-09 (×12): qty 2

## 2021-05-09 MED ORDER — LORAZEPAM 2 MG/ML IJ SOLN
0.5000 mg | Freq: Once | INTRAMUSCULAR | Status: AC
  Administered 2021-05-09: 0.5 mg via INTRAVENOUS
  Filled 2021-05-09: qty 1

## 2021-05-09 MED ORDER — DM-GUAIFENESIN ER 30-600 MG PO TB12
1.0000 | ORAL_TABLET | Freq: Two times a day (BID) | ORAL | Status: DC
  Administered 2021-05-09 – 2021-05-13 (×10): 1 via ORAL
  Filled 2021-05-09 (×10): qty 1

## 2021-05-09 MED ORDER — PREDNISONE 20 MG PO TABS
40.0000 mg | ORAL_TABLET | Freq: Every day | ORAL | Status: DC
  Filled 2021-05-09: qty 2

## 2021-05-09 MED ORDER — IPRATROPIUM-ALBUTEROL 0.5-2.5 (3) MG/3ML IN SOLN
3.0000 mL | RESPIRATORY_TRACT | Status: DC | PRN
  Administered 2021-05-11 – 2021-05-14 (×8): 3 mL via RESPIRATORY_TRACT
  Filled 2021-05-09 (×9): qty 3

## 2021-05-09 MED ORDER — PANTOPRAZOLE SODIUM 40 MG PO TBEC
40.0000 mg | DELAYED_RELEASE_TABLET | Freq: Every day | ORAL | Status: DC
  Administered 2021-05-09 – 2021-05-15 (×7): 40 mg via ORAL
  Filled 2021-05-09 (×7): qty 1

## 2021-05-09 MED ORDER — TAMSULOSIN HCL 0.4 MG PO CAPS
0.4000 mg | ORAL_CAPSULE | Freq: Every day | ORAL | Status: DC
  Administered 2021-05-09 – 2021-05-15 (×7): 0.4 mg via ORAL
  Filled 2021-05-09 (×7): qty 1

## 2021-05-09 MED ORDER — ENOXAPARIN SODIUM 40 MG/0.4ML IJ SOSY
40.0000 mg | PREFILLED_SYRINGE | INTRAMUSCULAR | Status: DC
  Administered 2021-05-09 – 2021-05-15 (×7): 40 mg via SUBCUTANEOUS
  Filled 2021-05-09 (×6): qty 0.4

## 2021-05-09 NOTE — Progress Notes (Signed)
PROGRESS NOTE    Ernest Singleton  ZOX:[id number] DOB: 12/22/1940 DOA: 05/08/2021 PCP: Glenda Chroman, MD   Brief Narrative:   Ernest Singleton is a 80 y.o. male with medical history significant for COPD on 3 LPM at home and tobacco abuse who presents to the emergency department due to several weeks of nonproductive cough and shortness of breath which increasingly worsened within the last 4-days.  Patient was admitted with acute COPD exacerbation in the setting of aspiration pneumonia and has been started on Unasyn as well as steroids and breathing treatments.  Assessment & Plan:   Principal Problem:   Acute exacerbation of chronic obstructive pulmonary disease (COPD) (HCC) Active Problems:   Cigarette smoker   Aspiration pneumonia (HCC)   BPH (benign prostatic hyperplasia)   Chronic respiratory failure with hypoxia (HCC)   Leukocytosis   Dehydration   SIRS (systemic inflammatory response syndrome) (HCC)   Sepsis present on admission secondary to aspiration pneumonia -Continue on Unasyn as prescribed -Monitor microbiology  Acute COPD exacerbation with chronic hypoxemia secondary to above -Continue steroids -Add Pulmicort -Breathing treatments -Wean oxygen to 3 L at baseline  History of tobacco abuse -Continue nicotine patch, counseled on cessation  BPH -Flomax   DVT prophylaxis: Lovenox Code Status: Full Family Communication: Daughter at bedside 12/3 Disposition Plan:  Status is: Observation  The patient will require care spanning > 2 midnights and should be moved to inpatient because:    Consultants:  None  Procedures:  None  Antimicrobials:  Anti-infectives (From admission, onward)    Start     Dose/Rate Route Frequency Ordered Stop   05/09/21 0230  Ampicillin-Sulbactam (UNASYN) 3 g in sodium chloride 0.9 % 100 mL IVPB        3 g 200 mL/hr over 30 Minutes Intravenous Every 6 hours 05/09/21 0144     05/08/21 2100  amoxicillin-clavulanate  (AUGMENTIN) 875-125 MG per tablet 1 tablet        1 tablet Oral  Once 05/08/21 2053 05/08/21 2113       Subjective: Patient seen and evaluated today with ongoing shortness of breath and wheezing noted at rest.  Objective: Vitals:   05/09/21 0600 05/09/21 0819 05/09/21 0830 05/09/21 1029  BP: 122/70   123/62  Pulse:    82  Resp: (!) 22   18  Temp: 98.7 F (37.1 C)   98.1 F (36.7 C)  TempSrc: Oral     SpO2: 96% 95% 98% 100%  Weight:      Height:        Intake/Output Summary (Last 24 hours) at 05/09/2021 1114 Last data filed at 05/09/2021 0300 Gross per 24 hour  Intake 290 ml  Output --  Net 290 ml   Filed Weights   05/08/21 1833 05/09/21 0217  Weight: 77.7 kg 62.7 kg    Examination:  General exam: Appears calm and comfortable  Respiratory system: Wheezing bilaterally. Respiratory effort normal.  Currently on 4 L nasal cannula. Cardiovascular system: S1 & S2 heard, RRR.  Gastrointestinal system: Abdomen is soft Central nervous system: Alert and awake Extremities: No edema Skin: No significant lesions noted Psychiatry: Flat affect.    Data Reviewed: I have personally reviewed following labs and imaging studies  CBC: Recent Labs  Lab 05/08/21 1901 05/09/21 0434  WBC 12.6* 15.2*  NEUTROABS 10.1*  --   HGB 13.1 12.7*  HCT 40.6 39.1  MCV 94.9 93.5  PLT 301 811   Basic Metabolic Panel: Recent Labs  Lab 05/08/21 1901 05/09/21 0434  NA 136 134*  K 4.2 4.0  CL 100 103  CO2 29 24  GLUCOSE 104* 202*  BUN 24* 20  CREATININE 0.92 0.83  CALCIUM 9.0 8.9  MG  --  2.3  PHOS  --  2.6   GFR: Estimated Creatinine Clearance: 61.9 mL/min (by C-G formula based on SCr of 0.83 mg/dL). Liver Function Tests: Recent Labs  Lab 05/08/21 1901 05/09/21 0434  AST 18 23  ALT 19 22  ALKPHOS 103 104  BILITOT 0.8 0.8  PROT 6.2* 6.1*  ALBUMIN 3.5 3.3*   No results for input(s): LIPASE, AMYLASE in the last 168 hours. No results for input(s): AMMONIA in the last 168  hours. Coagulation Profile: No results for input(s): INR, PROTIME in the last 168 hours. Cardiac Enzymes: No results for input(s): CKTOTAL, CKMB, CKMBINDEX, TROPONINI in the last 168 hours. BNP (last 3 results) No results for input(s): PROBNP in the last 8760 hours. HbA1C: No results for input(s): HGBA1C in the last 72 hours. CBG: No results for input(s): GLUCAP in the last 168 hours. Lipid Profile: No results for input(s): CHOL, HDL, LDLCALC, TRIG, CHOLHDL, LDLDIRECT in the last 72 hours. Thyroid Function Tests: No results for input(s): TSH, T4TOTAL, FREET4, T3FREE, THYROIDAB in the last 72 hours. Anemia Panel: No results for input(s): VITAMINB12, FOLATE, FERRITIN, TIBC, IRON, RETICCTPCT in the last 72 hours. Sepsis Labs: Recent Labs  Lab 05/09/21 0434  PROCALCITON <0.10    Recent Results (from the past 240 hour(s))  Resp Panel by RT-PCR (Flu A&B, Covid) Nasopharyngeal Swab     Status: None   Collection Time: 05/08/21  6:16 PM   Specimen: Nasopharyngeal Swab; Nasopharyngeal(NP) swabs in vial transport medium  Result Value Ref Range Status   SARS Coronavirus 2 by RT PCR NEGATIVE NEGATIVE Final    Comment: (NOTE) SARS-CoV-2 target nucleic acids are NOT DETECTED.  The SARS-CoV-2 RNA is generally detectable in upper respiratory specimens during the acute phase of infection. The lowest concentration of SARS-CoV-2 viral copies this assay can detect is 138 copies/mL. A negative result does not preclude SARS-Cov-2 infection and should not be used as the sole basis for treatment or other patient management decisions. A negative result may occur with  improper specimen collection/handling, submission of specimen other than nasopharyngeal swab, presence of viral mutation(s) within the areas targeted by this assay, and inadequate number of viral copies(<138 copies/mL). A negative result must be combined with clinical observations, patient history, and epidemiological information. The  expected result is Negative.  Fact Sheet for Patients:  EntrepreneurPulse.com.au  Fact Sheet for Healthcare Providers:  IncredibleEmployment.be  This test is no t yet approved or cleared by the [place] FDA and  has been authorized for detection and/or diagnosis of SARS-CoV-2 by FDA under an Emergency Use Authorization (EUA). This EUA will remain  in effect (meaning this test can be used) for the duration of the COVID-19 declaration under Section 564(b)(1) of the Act, 21 U.S.C.section 360bbb-3(b)(1), unless the authorization is terminated  or revoked sooner.       Influenza A by PCR NEGATIVE NEGATIVE Final   Influenza B by PCR NEGATIVE NEGATIVE Final    Comment: (NOTE) The Xpert Xpress SARS-CoV-2/FLU/RSV plus assay is intended as an aid in the diagnosis of influenza from Nasopharyngeal swab specimens and should not be used as a sole basis for treatment. Nasal washings and aspirates are unacceptable for Xpert Xpress SARS-CoV-2/FLU/RSV testing.  Fact Sheet for Patients: EntrepreneurPulse.com.au  Fact Sheet  for Healthcare Providers: IncredibleEmployment.be  This test is not yet approved or cleared by the [place] and has been authorized for detection and/or diagnosis of SARS-CoV-2 by FDA under an Emergency Use Authorization (EUA). This EUA will remain in effect (meaning this test can be used) for the duration of the COVID-19 declaration under Section 564(b)(1) of the Act, 21 U.S.C. section 360bbb-3(b)(1), unless the authorization is terminated or revoked.  Performed at [facility], [address], [place], [place] [address]          Radiology Studies: CT Chest W Contrast  Result Date: 05/09/2021 CLINICAL DATA:  Abnormal chest radiograph, shortness of breath EXAM: CT CHEST WITH CONTRAST TECHNIQUE: Multidetector CT imaging of the chest was performed during intravenous contrast  administration. CONTRAST:  164mL OMNIPAQUE IOHEXOL 300 MG/ML  SOLN COMPARISON:  Chest radiograph dated 05/08/2021 FINDINGS: Cardiovascular: The heart is normal in size. No pericardial effusion. No evidence of thoracic aortic aneurysm. Atherosclerotic calcifications of the aortic arch. Coronary atherosclerosis of the LAD and right coronary artery. Mediastinum/Nodes: No suspicious mediastinal lymphadenopathy. 7 mm left thyroid nodule, benign. Not clinically significant; no follow-up imaging recommended (ref: J Am Coll Radiol. 2015 Feb;12(2): 143-50). Lungs/Pleura: Moderate centrilobular and paraseptal emphysematous changes, upper lung predominant. Right middle lobe scarring/atelectasis with collapse. Mild patchy right lower lobe opacity (series 4/image 139), suggesting mild infection/pneumonia. Associated peribronchial thickening/mucous plugging in the bilateral lower lobe airways, suggesting aspiration is the underlying etiology. No suspicious pulmonary nodules. No pleural effusion or pneumothorax. Upper Abdomen: Visualized upper abdomen is grossly unremarkable, noting motion degradation. Musculoskeletal: Mild degenerative changes of the mid/lower thoracic spine. IMPRESSION: Suspected lower lobe aspiration with mild right lower lobe pneumonia. Associated right middle lobe atelectasis/collapse. Aortic Atherosclerosis (ICD10-I70.0) and Emphysema (ICD10-J43.9). Electronically Signed   By: Julian Hy M.D.   On: 05/09/2021 00:07   DG Chest Portable 1 View  Result Date: 05/08/2021 CLINICAL DATA:  Cough and short of breath EXAM: PORTABLE CHEST 1 VIEW COMPARISON:  12/25/2014 FINDINGS: Heart size and vascularity normal.  Negative for heart failure. COPD with pulmonary hyperinflation Abnormal density right inferior hilar region. This may be within the right middle lobe or right lower lobe. Possible atelectasis or mass lesion. No pleural effusion. IMPRESSION: Right infrahilar density which could be a mass lesion.  Recommend follow-up CT chest with contrast COPD Electronically Signed   By: Franchot Gallo M.D.   On: 05/08/2021 18:59        Scheduled Meds:  albuterol  2.5 mg/hr Nebulization Once   budesonide (PULMICORT) nebulizer solution  0.25 mg Nebulization BID   dextromethorphan-guaiFENesin  1 tablet Oral BID   enoxaparin (LOVENOX) injection  40 mg Subcutaneous Q24H   ipratropium-albuterol  3 mL Nebulization Q6H   methylPREDNISolone (SOLU-MEDROL) injection  40 mg Intravenous Q12H   Followed by   Derrill Memo ON 05/10/2021] predniSONE  40 mg Oral Q breakfast   mometasone-formoterol  2 puff Inhalation BID   pantoprazole  40 mg Oral Daily   tamsulosin  0.4 mg Oral Daily   Continuous Infusions:  ampicillin-sulbactam (UNASYN) IV 3 g (05/09/21 1000)     LOS: 0 days    Time spent: 35 minutes    Nicolet Griffy Darleen Crocker, DO [facility]  If 7PM-7AM, please contact night-coverage [email or web address] 05/09/2021, 11:14 AM

## 2021-05-10 DIAGNOSIS — J441 Chronic obstructive pulmonary disease with (acute) exacerbation: Secondary | ICD-10-CM | POA: Diagnosis not present

## 2021-05-10 LAB — BASIC METABOLIC PANEL
Anion gap: 4 — ABNORMAL LOW (ref 5–15)
BUN: 17 mg/dL (ref 8–23)
CO2: 30 mmol/L (ref 22–32)
Calcium: 8.7 mg/dL — ABNORMAL LOW (ref 8.9–10.3)
Chloride: 105 mmol/L (ref 98–111)
Creatinine, Ser: 0.93 mg/dL (ref 0.61–1.24)
GFR, Estimated: >60 mL/min (ref >60)
Glucose, Bld: 132 mg/dL — ABNORMAL HIGH (ref 70–99)
Potassium: 4.4 mmol/L (ref 3.5–5.1)
Sodium: 139 mmol/L (ref 135–145)

## 2021-05-10 LAB — CBC
HCT: 38.1 % — ABNORMAL LOW (ref 39.0–52.0)
Hemoglobin: 11.8 g/dL — ABNORMAL LOW (ref 13.0–17.0)
MCH: 30.3 pg (ref 26.0–34.0)
MCHC: 31 g/dL (ref 30.0–36.0)
MCV: 97.9 fL (ref 80.0–100.0)
Platelets: 307 10*3/uL (ref 150–400)
RBC: 3.89 MIL/uL — ABNORMAL LOW (ref 4.22–5.81)
RDW: 13.2 % (ref 11.5–15.5)
WBC: 20 10*3/uL — ABNORMAL HIGH (ref 4.0–10.5)
nRBC: 0 % (ref 0.0–0.2)

## 2021-05-10 LAB — MAGNESIUM: Magnesium: 2.1 mg/dL (ref 1.7–2.4)

## 2021-05-10 MED ORDER — ACETYLCYSTEINE 20 % IN SOLN
3.0000 mL | Freq: Three times a day (TID) | RESPIRATORY_TRACT | Status: DC
  Administered 2021-05-10 – 2021-05-15 (×16): 3 mL via RESPIRATORY_TRACT
  Filled 2021-05-10 (×15): qty 4

## 2021-05-10 MED ORDER — FLUOXETINE HCL 20 MG PO CAPS
20.0000 mg | ORAL_CAPSULE | Freq: Every day | ORAL | Status: DC
  Administered 2021-05-10 – 2021-05-15 (×6): 20 mg via ORAL
  Filled 2021-05-10 (×6): qty 1

## 2021-05-10 MED ORDER — ACETYLCYSTEINE 20 % IN SOLN
RESPIRATORY_TRACT | Status: AC
  Administered 2021-05-10: 09:00:00 3 mL via RESPIRATORY_TRACT
  Filled 2021-05-10: qty 4

## 2021-05-10 MED ORDER — ROFLUMILAST 500 MCG PO TABS
500.0000 ug | ORAL_TABLET | Freq: Every day | ORAL | Status: DC
  Administered 2021-05-10 – 2021-05-15 (×6): 500 ug via ORAL
  Filled 2021-05-10 (×6): qty 1

## 2021-05-10 MED ORDER — METHYLPREDNISOLONE SODIUM SUCC 125 MG IJ SOLR
60.0000 mg | Freq: Two times a day (BID) | INTRAMUSCULAR | Status: DC
  Administered 2021-05-10 – 2021-05-12 (×5): 60 mg via INTRAVENOUS
  Filled 2021-05-10 (×5): qty 2

## 2021-05-10 MED ORDER — FINASTERIDE 5 MG PO TABS
5.0000 mg | ORAL_TABLET | Freq: Every day | ORAL | Status: DC
  Administered 2021-05-10 – 2021-05-15 (×6): 5 mg via ORAL
  Filled 2021-05-10 (×6): qty 1

## 2021-05-10 NOTE — Progress Notes (Signed)
Tele called patient had a 6 beat run of Vtach. MD [name] made aware.

## 2021-05-10 NOTE — Progress Notes (Signed)
PROGRESS NOTE    Ernest Singleton  HMC:[id number] DOB: 12-Aug-1940 DOA: 05/08/2021 PCP: Glenda Chroman, MD   Brief Narrative:   Ernest Singleton is a 80 y.o. male with medical history significant for COPD on 3 LPM at home and tobacco abuse who presents to the emergency department due to several weeks of nonproductive cough and shortness of breath which increasingly worsened within the last 4-days.  Patient was admitted with acute COPD exacerbation in the setting of aspiration pneumonia and has been started on Unasyn as well as steroids and breathing treatments.  Assessment & Plan:   Principal Problem:   Acute exacerbation of chronic obstructive pulmonary disease (COPD) (HCC) Active Problems:   Cigarette smoker   Aspiration pneumonia (HCC)   BPH (benign prostatic hyperplasia)   Chronic respiratory failure with hypoxia (HCC)   Leukocytosis   Dehydration   SIRS (systemic inflammatory response syndrome) (HCC)   Sepsis present on admission secondary to aspiration pneumonia -Continue on Unasyn as prescribed -Monitor microbiology   Acute COPD exacerbation with chronic hypoxemia secondary to above -Continue steroids with increased solumedrol to 60mg  BID -Continue pulmicort -Added mucomyst and flutter valve along with vibratory vest -Breathing treatments ongoing -Wean oxygen to 3 L at baseline   History of tobacco abuse -Continue nicotine patch, counseled on cessation   BPH -Flomax     DVT prophylaxis: Lovenox Code Status: Full Family Communication: Daughter at bedside 12/4 Disposition Plan:  Status is: Inpatient  Remains inpatient appropriate because: Need for IV medications.     Consultants:  None   Procedures:  None  Antimicrobials:  Anti-infectives (From admission, onward)    Start     Dose/Rate Route Frequency Ordered Stop   05/09/21 0230  Ampicillin-Sulbactam (UNASYN) 3 g in sodium chloride 0.9 % 100 mL IVPB        3 g 200 mL/hr over 30 Minutes  Intravenous Every 6 hours 05/09/21 0144     05/08/21 2100  amoxicillin-clavulanate (AUGMENTIN) 875-125 MG per tablet 1 tablet        1 tablet Oral  Once 05/08/21 2053 05/08/21 2113       Subjective: Patient seen and evaluated today with ongoing dyspnea and worsening congestion this am.  Objective: Vitals:   05/10/21 0521 05/10/21 0812 05/10/21 0817 05/10/21 0843  BP: (!) 102/46     Pulse: 64     Resp: 20     Temp: (!) 97.1 F (36.2 C)     TempSrc:      SpO2: 98% 97% 100% 100%  Weight:      Height:        Intake/Output Summary (Last 24 hours) at 05/10/2021 1023 Last data filed at 05/09/2021 1900 Gross per 24 hour  Intake 480 ml  Output --  Net 480 ml   Filed Weights   05/08/21 1833 05/09/21 0217  Weight: 77.7 kg 62.7 kg    Examination:  General exam: Appears calm and comfortable  Respiratory system: B wheezing and rhonchi and currently on 4L [place] Cardiovascular system: S1 & S2 heard, RRR.  Gastrointestinal system: Abdomen is soft Central nervous system: Alert and awake Extremities: No edema Skin: No significant lesions noted Psychiatry: Flat affect.    Data Reviewed: I have personally reviewed following labs and imaging studies  CBC: Recent Labs  Lab 05/08/21 1901 05/09/21 0434 05/10/21 0542  WBC 12.6* 15.2* 20.0*  NEUTROABS 10.1*  --   --   HGB 13.1 12.7* 11.8*  HCT 40.6 39.1 38.1*  MCV 94.9 93.5 97.9  PLT 301 288 628   Basic Metabolic Panel: Recent Labs  Lab 05/08/21 1901 05/09/21 0434 05/10/21 0542  NA 136 134* 139  K 4.2 4.0 4.4  CL 100 103 105  CO2 29 24 30   GLUCOSE 104* 202* 132*  BUN 24* 20 17  CREATININE 0.92 0.83 0.93  CALCIUM 9.0 8.9 8.7*  MG  --  2.3 2.1  PHOS  --  2.6  --    GFR: Estimated Creatinine Clearance: 55.2 mL/min (by C-G formula based on SCr of 0.93 mg/dL). Liver Function Tests: Recent Labs  Lab 05/08/21 1901 05/09/21 0434  AST 18 23  ALT 19 22  ALKPHOS 103 104  BILITOT 0.8 0.8  PROT 6.2* 6.1*  ALBUMIN 3.5  3.3*   No results for input(s): LIPASE, AMYLASE in the last 168 hours. No results for input(s): AMMONIA in the last 168 hours. Coagulation Profile: No results for input(s): INR, PROTIME in the last 168 hours. Cardiac Enzymes: No results for input(s): CKTOTAL, CKMB, CKMBINDEX, TROPONINI in the last 168 hours. BNP (last 3 results) No results for input(s): PROBNP in the last 8760 hours. HbA1C: No results for input(s): HGBA1C in the last 72 hours. CBG: No results for input(s): GLUCAP in the last 168 hours. Lipid Profile: No results for input(s): CHOL, HDL, LDLCALC, TRIG, CHOLHDL, LDLDIRECT in the last 72 hours. Thyroid Function Tests: No results for input(s): TSH, T4TOTAL, FREET4, T3FREE, THYROIDAB in the last 72 hours. Anemia Panel: No results for input(s): VITAMINB12, FOLATE, FERRITIN, TIBC, IRON, RETICCTPCT in the last 72 hours. Sepsis Labs: Recent Labs  Lab 05/09/21 0434  PROCALCITON <0.10    Recent Results (from the past 240 hour(s))  Resp Panel by RT-PCR (Flu A&B, Covid) Nasopharyngeal Swab     Status: None   Collection Time: 05/08/21  6:16 PM   Specimen: Nasopharyngeal Swab; Nasopharyngeal(NP) swabs in vial transport medium  Result Value Ref Range Status   SARS Coronavirus 2 by RT PCR NEGATIVE NEGATIVE Final    Comment: (NOTE) SARS-CoV-2 target nucleic acids are NOT DETECTED.  The SARS-CoV-2 RNA is generally detectable in upper respiratory specimens during the acute phase of infection. The lowest concentration of SARS-CoV-2 viral copies this assay can detect is 138 copies/mL. A negative result does not preclude SARS-Cov-2 infection and should not be used as the sole basis for treatment or other patient management decisions. A negative result may occur with  improper specimen collection/handling, submission of specimen other than nasopharyngeal swab, presence of viral mutation(s) within the areas targeted by this assay, and inadequate number of viral copies(<138  copies/mL). A negative result must be combined with clinical observations, patient history, and epidemiological information. The expected result is Negative.  Fact Sheet for Patients:  EntrepreneurPulse.com.au  Fact Sheet for Healthcare Providers:  IncredibleEmployment.be  This test is no t yet approved or cleared by the [place] FDA and  has been authorized for detection and/or diagnosis of SARS-CoV-2 by FDA under an Emergency Use Authorization (EUA). This EUA will remain  in effect (meaning this test can be used) for the duration of the COVID-19 declaration under Section 564(b)(1) of the Act, 21 U.S.C.section 360bbb-3(b)(1), unless the authorization is terminated  or revoked sooner.       Influenza A by PCR NEGATIVE NEGATIVE Final   Influenza B by PCR NEGATIVE NEGATIVE Final    Comment: (NOTE) The Xpert Xpress SARS-CoV-2/FLU/RSV plus assay is intended as an aid in the diagnosis of influenza from Nasopharyngeal swab specimens  and should not be used as a sole basis for treatment. Nasal washings and aspirates are unacceptable for Xpert Xpress SARS-CoV-2/FLU/RSV testing.  Fact Sheet for Patients: EntrepreneurPulse.com.au  Fact Sheet for Healthcare Providers: IncredibleEmployment.be  This test is not yet approved or cleared by the [place] FDA and has been authorized for detection and/or diagnosis of SARS-CoV-2 by FDA under an Emergency Use Authorization (EUA). This EUA will remain in effect (meaning this test can be used) for the duration of the COVID-19 declaration under Section 564(b)(1) of the Act, 21 U.S.C. section 360bbb-3(b)(1), unless the authorization is terminated or revoked.  Performed at [facility], [address] [place], [place] [address]          Radiology Studies: CT Chest W Contrast  Result Date: 05/09/2021 CLINICAL DATA:  Abnormal chest radiograph, shortness of breath  EXAM: CT CHEST WITH CONTRAST TECHNIQUE: Multidetector CT imaging of the chest was performed during intravenous contrast administration. CONTRAST:  136mL OMNIPAQUE IOHEXOL 300 MG/ML  SOLN COMPARISON:  Chest radiograph dated 05/08/2021 FINDINGS: Cardiovascular: The heart is normal in size. No pericardial effusion. No evidence of thoracic aortic aneurysm. Atherosclerotic calcifications of the aortic arch. Coronary atherosclerosis of the LAD and right coronary artery. Mediastinum/Nodes: No suspicious mediastinal lymphadenopathy. 7 mm left thyroid nodule, benign. Not clinically significant; no follow-up imaging recommended (ref: J Am Coll Radiol. 2015 Feb;12(2): 143-50). Lungs/Pleura: Moderate centrilobular and paraseptal emphysematous changes, upper lung predominant. Right middle lobe scarring/atelectasis with collapse. Mild patchy right lower lobe opacity (series 4/image 139), suggesting mild infection/pneumonia. Associated peribronchial thickening/mucous plugging in the bilateral lower lobe airways, suggesting aspiration is the underlying etiology. No suspicious pulmonary nodules. No pleural effusion or pneumothorax. Upper Abdomen: Visualized upper abdomen is grossly unremarkable, noting motion degradation. Musculoskeletal: Mild degenerative changes of the mid/lower thoracic spine. IMPRESSION: Suspected lower lobe aspiration with mild right lower lobe pneumonia. Associated right middle lobe atelectasis/collapse. Aortic Atherosclerosis (ICD10-I70.0) and Emphysema (ICD10-J43.9). Electronically Signed   By: Julian Hy M.D.   On: 05/09/2021 00:07   DG Chest Portable 1 View  Result Date: 05/08/2021 CLINICAL DATA:  Cough and short of breath EXAM: PORTABLE CHEST 1 VIEW COMPARISON:  12/25/2014 FINDINGS: Heart size and vascularity normal.  Negative for heart failure. COPD with pulmonary hyperinflation Abnormal density right inferior hilar region. This may be within the right middle lobe or right lower lobe. Possible  atelectasis or mass lesion. No pleural effusion. IMPRESSION: Right infrahilar density which could be a mass lesion. Recommend follow-up CT chest with contrast COPD Electronically Signed   By: Franchot Gallo M.D.   On: 05/08/2021 18:59        Scheduled Meds:  acetylcysteine  3 mL Nebulization TID   albuterol  2.5 mg/hr Nebulization Once   budesonide (PULMICORT) nebulizer solution  0.25 mg Nebulization BID   dextromethorphan-guaiFENesin  1 tablet Oral BID   enoxaparin (LOVENOX) injection  40 mg Subcutaneous Q24H   finasteride  5 mg Oral Daily   FLUoxetine  20 mg Oral Daily   ipratropium-albuterol  3 mL Nebulization Q6H   methylPREDNISolone (SOLU-MEDROL) injection  60 mg Intravenous Q12H   mometasone-formoterol  2 puff Inhalation BID   pantoprazole  40 mg Oral Daily   roflumilast  500 mcg Oral Daily   tamsulosin  0.4 mg Oral Daily   Continuous Infusions:  ampicillin-sulbactam (UNASYN) IV 3 g (05/10/21 0914)     LOS: 1 day    Time spent: 35 minutes    Bich Mchaney Darleen Crocker, DO [facility]  If  7PM-7AM, please contact night-coverage [email or web address] 05/10/2021, 10:23 AM

## 2021-05-11 DIAGNOSIS — J9611 Chronic respiratory failure with hypoxia: Secondary | ICD-10-CM | POA: Diagnosis not present

## 2021-05-11 DIAGNOSIS — J441 Chronic obstructive pulmonary disease with (acute) exacerbation: Secondary | ICD-10-CM

## 2021-05-11 LAB — CBC
HCT: 39.6 % (ref 39.0–52.0)
Hemoglobin: 12.4 g/dL — ABNORMAL LOW (ref 13.0–17.0)
MCH: 30 pg (ref 26.0–34.0)
MCHC: 31.3 g/dL (ref 30.0–36.0)
MCV: 95.9 fL (ref 80.0–100.0)
Platelets: 328 10*3/uL (ref 150–400)
RBC: 4.13 MIL/uL — ABNORMAL LOW (ref 4.22–5.81)
RDW: 13.1 % (ref 11.5–15.5)
WBC: 13.9 10*3/uL — ABNORMAL HIGH (ref 4.0–10.5)
nRBC: 0 % (ref 0.0–0.2)

## 2021-05-11 LAB — BASIC METABOLIC PANEL
Anion gap: 5 (ref 5–15)
BUN: 16 mg/dL (ref 8–23)
CO2: 28 mmol/L (ref 22–32)
Calcium: 8.7 mg/dL — ABNORMAL LOW (ref 8.9–10.3)
Chloride: 104 mmol/L (ref 98–111)
Creatinine, Ser: 0.91 mg/dL (ref 0.61–1.24)
GFR, Estimated: >60 mL/min (ref >60)
Glucose, Bld: 139 mg/dL — ABNORMAL HIGH (ref 70–99)
Potassium: 4.2 mmol/L (ref 3.5–5.1)
Sodium: 137 mmol/L (ref 135–145)

## 2021-05-11 LAB — MAGNESIUM: Magnesium: 2.1 mg/dL (ref 1.7–2.4)

## 2021-05-11 MED ORDER — ENSURE ENLIVE PO LIQD
237.0000 mL | Freq: Two times a day (BID) | ORAL | Status: DC
  Administered 2021-05-12 – 2021-05-15 (×5): 237 mL via ORAL

## 2021-05-11 MED ORDER — LIDOCAINE 5 % EX PTCH
1.0000 | MEDICATED_PATCH | Freq: Every day | CUTANEOUS | Status: DC
  Administered 2021-05-11 – 2021-05-15 (×5): 1 via TRANSDERMAL
  Filled 2021-05-11 (×5): qty 1

## 2021-05-11 MED ORDER — SODIUM CHLORIDE 0.9 % IV SOLN
1.0000 g | INTRAVENOUS | Status: DC
  Administered 2021-05-11 – 2021-05-14 (×4): 1 g via INTRAVENOUS
  Filled 2021-05-11 (×4): qty 10

## 2021-05-11 MED ORDER — ARFORMOTEROL TARTRATE 15 MCG/2ML IN NEBU
15.0000 ug | INHALATION_SOLUTION | Freq: Two times a day (BID) | RESPIRATORY_TRACT | Status: DC
  Administered 2021-05-11 – 2021-05-15 (×8): 15 ug via RESPIRATORY_TRACT
  Filled 2021-05-11 (×8): qty 2

## 2021-05-11 MED ORDER — ADULT MULTIVITAMIN W/MINERALS CH
1.0000 | ORAL_TABLET | Freq: Every day | ORAL | Status: DC
  Administered 2021-05-11 – 2021-05-15 (×5): 1 via ORAL
  Filled 2021-05-11 (×5): qty 1

## 2021-05-11 NOTE — Progress Notes (Signed)
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   GOAL:  Patient will meet greater than or equal to 90% of their needs   MONITOR:  PO intake, Supplement acceptance, Labs, Skin, Weight trends  REASON FOR ASSESSMENT:   Malnutrition Screening Tool    ASSESSMENT: Patient is a 80 yo male with hx of COPD (3 L-O2) at home, Hyperlipidemia, Osteoarthritis and tobacco use. Presents with acute COPD exacerbation, aspiration PNA, Dehydration, SIRS.   Patient is eating 50-100% of the last 4 meals. Able to feed himself. Patient lives with his wife and they share cooking meals. His best meal is breakfast. Berniece Salines, eggs, toast and juice. He drinks Boost at home (prefers chocolate).   Patients granddaughter has brought in favorite tea beverage and fresh fruit, peanut butter crackers. She is bringing him a french dip sandwich from [employer] for dinner.   Patient weight history reviewed. His currently weighs 138 lb (62.7 kg).   Medications include: solumedrol, protonix.   Drip: Rocephin.   Labs: BMP Latest Ref Rng & Units 05/11/2021 05/10/2021 05/09/2021  Glucose 70 - 99 mg/dL 139(H) 132(H) 202(H)  BUN 8 - 23 mg/dL 16 17 20   Creatinine 0.61 - 1.24 mg/dL 0.91 0.93 0.83  Sodium 135 - 145 mmol/L 137 139 134(L)  Potassium 3.5 - 5.1 mmol/L 4.2 4.4 4.0  Chloride 98 - 111 mmol/L 104 105 103  CO2 22 - 32 mmol/L 28 30 24   Calcium 8.9 - 10.3 mg/dL 8.7(L) 8.7(L) 8.9      NUTRITION - FOCUSED  PHYSICAL EXAM:  Flowsheet Row Most Recent Value  Orbital Region Mild depletion  Upper Arm Region Moderate depletion  Thoracic and Lumbar Region Moderate depletion  Buccal Region No depletion  Temple Region Mild depletion  Clavicle Bone Region Moderate depletion  Clavicle and Acromion Bone Region Moderate depletion  Scapular Bone Region Mild depletion  Dorsal Hand Moderate depletion  Patellar Region Unable to assess  Anterior Thigh Region Unable to assess  Posterior Calf Region Unable to assess  Edema (RD Assessment) Mild  Hair Reviewed  Eyes Reviewed  Mouth Reviewed  Skin Reviewed  Nails Reviewed      Diet Order:   Diet Order             Diet Heart Room service appropriate? Yes; Fluid consistency: Thin  Diet effective now                   EDUCATION NEEDS:  Education needs have been addressed  Skin:  Skin Assessment: Reviewed RN Assessment  Last BM:  12/4  Height:   Ht Readings from Last 1 Encounters:  05/08/21 5\' 9"  (1.753 m)    Weight:   Wt Readings from Last 1 Encounters:  05/09/21 62.7 kg    Ideal Body Weight:   66 kg  BMI:  Body mass index is 20.41 kg/m.  Estimated Nutritional Needs:   Kcal:  7672-0947  Protein:  88-95 gr  Fluid:  2 liters daily  Colman Cater MS,RD,CSG,LDN Contact: Shea Evans

## 2021-05-11 NOTE — Consult Note (Addendum)
Name: Ernest Singleton MRN: [id number] DOB: 04/04/1941    ADMISSION DATE:  05/08/2021 CONSULTATION DATE:  05/11/2021   REFERRING MD :  Lanice Shirts  CHIEF COMPLAINT: Shortness of breath, chest tightness   HISTORY OF PRESENT ILLNESS: 80 year old with gold D COPD, chronic hypoxic respiratory failure, steroid-dependent hospitalized for COPD flare and aspiration pneumonia.  PCCM consulted due to slow improvement.  He is maintained on Symbicort and Spiriva and 10 mg of prednisone.  He has completed pulmonary rehab in the past.  He is maintained on 3 L of oxygen .  He is known to have stable pulmonary nodules largest measuring 1.1 cm on CT chest 07/25/2020, stable since 2017 .he underwent bronchoscopy 02/2019 for chronic right middle lobe consolidation and bronchiectasis.  No mycobacteria was isolated he had a negative PET scan in 2020.  PFTs have shown severe airway obstruction I reviewed his primary pulmonologist notes,dr Michela Pitcher  He was admitted 12/3 with a history of several weeks of shortness of breath and nonproductive cough, presumptive diagnosis was COPD exacerbation.  Influenza and COVID testing was negative.  CBC showed leukocytosis.  Chest x-ray suggested right infrahilar mass/consolidation.  CT chest with contrast showed moderate emphysema, chronic right middle lobe scarring/collapse.  Patchy right lower lobe opacity with associated mucous plugging and bilateral lower lobe airways and aspiration was suggested he was treated with Unasyn and steroids and bronchodilators  PAST MEDICAL HISTORY :  Past Medical History:  Diagnosis Date   BPH (benign prostatic hyperplasia)    COPD (chronic obstructive pulmonary disease) (HCC)    Dr. Melvyn Novas   Hyperlipidemia    Osteoarthritis    Seasonal allergies    Past Surgical History:  Procedure Laterality Date   CHOLECYSTECTOMY     KNEE ARTHROSCOPY     Prior to Admission medications   Medication Sig Start Date End Date Taking? Authorizing Provider   celecoxib (CELEBREX) 200 MG capsule TAKE ONE (1) CAPSULE EACH DAY Patient taking differently: Take 200 mg by mouth daily. 06/09/16  Yes Terald Sleeper, PA-C  cetirizine (ZYRTEC) 10 MG chewable tablet Chew 10 mg by mouth daily.    Yes [provider]  Cholecalciferol (VITAMIN D-3) 1000 UNITS CAPS Take 1,000 Units by mouth daily.   Yes [provider]  finasteride (PROSCAR) 5 MG tablet Take 5 mg by mouth daily.   Yes [provider]  predniSONE (DELTASONE) 10 MG tablet Take  4 each am x 2 days,   2 each am x 2 days,  1 each am x 2 days and stop 12/25/14  Yes Tanda Rockers, MD  roflumilast (DALIRESP) 500 MCG TABS tablet Take 500 mcg by mouth daily as needed. 04/09/21  Yes [provider]  SPIRIVA RESPIMAT 2.5 MCG/ACT AERS Inhale 2 puffs into the lungs daily. 03/23/21  Yes [provider]  SYMBICORT 160-4.5 MCG/ACT inhaler INHALE 2 PUFFS EVERY 12 HOURS Patient taking differently: 2 puffs every 12 (twelve) hours. 01/30/14  Yes Tanda Rockers, MD  Tamsulosin HCl (FLOMAX) 0.4 MG CAPS Take 0.4 mg by mouth daily.   Yes [provider]  VENTOLIN HFA 108 (90 Base) MCG/ACT inhaler USE 2 PUFFS EVERY 4 TO 6 HOURS AS NEEDED 06/09/16  Yes Terald Sleeper, PA-C  aspirin 81 MG tablet Take 81 mg by mouth daily. Patient not taking: Reported on 05/09/2021    [provider]  atorvastatin (LIPITOR) 10 MG tablet Take 10 mg by mouth daily. Patient not taking: Reported on 05/09/2021 04/01/21  [provider]  Azelastine-Fluticasone 137-50 MCG/ACT SUSP Place 2 puffs into the nose as needed. Patient not taking: Reported on 05/09/2021 06/12/14 10/14/15  Tanda Rockers, MD  FLUoxetine (PROZAC) 20 MG capsule Take 20 mg by mouth daily. Patient not taking: Reported on 05/09/2021 04/15/21   [provider]  varenicline (CHANTIX PAK) 0.5 MG X 11 & 1 MG X 42 tablet Take one 0.5 mg tablet by mouth once daily for 3 days, then increase to one 0.5 mg tablet twice  daily for 4 days, then increase to one 1 mg tablet twice daily. Patient not taking: Reported on 05/09/2021 12/25/14   Tanda Rockers, MD   Allergies  Allergen Reactions   Requip [Ropinirole Hcl]     Dizziness     FAMILY HISTORY:  Family History  Problem Relation Age of Onset   Lung cancer Paternal Uncle        ? if smoker or not   Brain cancer Paternal Uncle    Heart disease Father    SOCIAL HISTORY:  reports that he has been smoking cigarettes. He has a 54.00 pack-year smoking history. He has never used smokeless tobacco. He reports that he does not drink alcohol and does not use drugs.  REVIEW OF SYSTEMS:    Shortness of breath Chest tightness Cough with white-yellow sputum production  Constitutional: Negative for fever, chills, weight loss, malaise/fatigue and diaphoresis.  HENT: Negative for hearing loss, ear pain, nosebleeds, congestion, sore throat, neck pain, tinnitus and ear discharge.   Eyes: Negative for blurred vision, double vision, photophobia, pain, discharge and redness.  Respiratory: Negative for hemoptysis, and stridor.   Cardiovascular: Negative for chest pain, palpitations, orthopnea, claudication, leg swelling and PND.  Gastrointestinal: Negative for heartburn, nausea, vomiting, abdominal pain, diarrhea, constipation, blood in stool and melena.  Genitourinary: Negative for dysuria, urgency, frequency, hematuria and flank pain.  Musculoskeletal: Negative for myalgias, back pain, joint pain and falls.  Skin: Negative for itching and rash.  Neurological: Negative for dizziness, tingling, tremors, sensory change, speech change, focal weakness, seizures, loss of consciousness, weakness and headaches.  Endo/Heme/Allergies: Negative for environmental allergies and polydipsia. Does not bruise/bleed easily.  SUBJECTIVE:   VITAL SIGNS: Temp:  [97.7 F (36.5 C)-98.6 F (37 C)] 98.4 F (36.9 C) (12/05 0430) Pulse Rate:  [77-90] 77 (12/05 0430) Resp:  [18-23] 18  (12/05 0430) BP: (115-136)/(62-67) 134/65 (12/05 0430) SpO2:  [94 %-99 %] 98 % (12/05 0831)  PHYSICAL EXAMINATION: Gen. Pleasant, elderly, well-nourished, in no distress, normal affect ENT - no lesions, no post nasal drip Neck: No JVD, no thyromegaly, no carotid bruits Lungs: Barrel chest, no use of accessory muscles, no dullness to percussion, decreased bilateral without rales or rhonchi  Cardiovascular: Rhythm regular, heart sounds  normal, no murmurs, no peripheral edema Abdomen: soft and non-tender, no hepatosplenomegaly, BS normal. Musculoskeletal: No deformities, no cyanosis or clubbing Neuro:  alert, non focal, no tremors Skin:  Warm, no lesions/ rash   Recent Labs  Lab 05/09/21 0434 05/10/21 0542 05/11/21 0446  NA 134* 139 137  K 4.0 4.4 4.2  CL 103 105 104  CO2 24 30 28   BUN 20 17 16   CREATININE 0.83 0.93 0.91  GLUCOSE 202* 132* 139*   Recent Labs  Lab 05/09/21 0434 05/10/21 0542 05/11/21 0446  HGB 12.7* 11.8* 12.4*  HCT 39.1 38.1* 39.6  WBC 15.2* 20.0* 13.9*  PLT 288 307 328   No results found.  ASSESSMENT / PLAN:  Right lower lobe  pneumonia. -No obvious history to suggest aspiration, will change antibiotics to ceftriaxone, obtain sputum culture for completion to rule out resistant organisms given bronchiectasis   Bronchiectasis Chronic middle lobe atelectasis, negative bronchoscopy in the past for NTM -Agree with aggressive airway clearance measures with Mucomyst, chest vest twice daily   Gold D COPD with exacerbation -IV Solu-Medrol 60 every 12 Budesonide and duo nebs, add Brovana   -Lidoderm patch for pain related to coughing, okay to use Tylenol  - Chronic hypoxic respiratory failure -continue oxygen maintaining saturation 90% and above  Patient and daughter updated at bedside  Kara Mead MD. Shade Flood. [facility] Pager [phone] If no response call [phone]    05/11/2021, 10:30 AM

## 2021-05-11 NOTE — Progress Notes (Signed)
05/11/2021 2200:  Pt's daughter at bedside inquiring about 0200 scheduled breathing treatment while this RN was in room providing pt care. This RN told daughter that "there is not a breathing treatment scheduled for 0200 but I will check with the respiratory therapist to confirm." Respiratory therapist confirmed that there is no breathing treatment scheduled for 0200 but the pt had received one on previous nights and that the "MD may have changed the orders." That was in fact confirmed by both this RN and the respiratory therapist via the MAR. This information was relayed to the daughter and pt. Daughter and pt both educated by this RN on the difference between scheduled meds and PRN meds. Respiratory therapist will return to administer PRN neb treatment if needed.

## 2021-05-11 NOTE — Progress Notes (Signed)
PROGRESS NOTE    Ernest Singleton  GUY:[id number] DOB: 1940/08/09 DOA: 05/08/2021 PCP: Glenda Chroman, MD   Brief Narrative:   Ernest Singleton is a 80 y.o. male with medical history significant for COPD on 3 LPM at home and tobacco abuse who presents to the emergency department due to several weeks of nonproductive cough and shortness of breath which increasingly worsened within the last 4-days.  Patient was admitted with acute COPD exacerbation in the setting of aspiration pneumonia and has been started on Unasyn as well as steroids and breathing treatments.  Assessment & Plan:   Principal Problem:   Acute exacerbation of chronic obstructive pulmonary disease (COPD) (HCC) Active Problems:   Cigarette smoker   Aspiration pneumonia (HCC)   BPH (benign prostatic hyperplasia)   Chronic respiratory failure with hypoxia (HCC)   Leukocytosis   Dehydration   SIRS (systemic inflammatory response syndrome) (HCC)   Sepsis present on admission secondary to aspiration pneumonia -CT demonstrating some right lower lobe aspiration pneumonia with right middle lobe atelectasis/collapse -Procalcitonin low -Continue on Unasyn as prescribed   Acute COPD exacerbation with chronic hypoxemia secondary to above -Currently with poor progression; appreciate PCCM evaluation to ensure optimal medical management -Continue steroids with increased solumedrol to 60mg  BID on 12/4 -Continue pulmicort -Added mucomyst and flutter valve along with vibratory vest -Breathing treatments ongoing -Wean oxygen to 3 L at baseline   History of ongoing tobacco abuse -Counseled on cessation   BPH -Flomax     DVT prophylaxis: Lovenox Code Status: Full Family Communication: Daughter at bedside 12/5 Disposition Plan:  Status is: Inpatient   Remains inpatient appropriate because: Need for IV medications.       Consultants:  PCCM   Procedures:  None  Antimicrobials:  Anti-infectives (From admission,  onward)    Start     Dose/Rate Route Frequency Ordered Stop   05/09/21 0230  Ampicillin-Sulbactam (UNASYN) 3 g in sodium chloride 0.9 % 100 mL IVPB        3 g 200 mL/hr over 30 Minutes Intravenous Every 6 hours 05/09/21 0144     05/08/21 2100  amoxicillin-clavulanate (AUGMENTIN) 875-125 MG per tablet 1 tablet        1 tablet Oral  Once 05/08/21 2053 05/08/21 2113       Subjective: Patient seen and evaluated today with ongoing chest congestion and cough with poor sputum production.  He continues to have chest tightness and shortness of breath and does not appear to be improving much over the last 2 days.  Objective: Vitals:   05/11/21 0154 05/11/21 0430 05/11/21 0829 05/11/21 0831  BP:  134/65    Pulse:  77    Resp:  18    Temp:  98.4 F (36.9 C)    TempSrc:  Oral    SpO2: 96% 96% 94% 98%  Weight:      Height:        Intake/Output Summary (Last 24 hours) at 05/11/2021 1146 Last data filed at 05/11/2021 0209 Gross per 24 hour  Intake 995.75 ml  Output --  Net 995.75 ml   Filed Weights   05/08/21 1833 05/09/21 0217  Weight: 77.7 kg 62.7 kg    Examination:  General exam: Appears calm and comfortable  Respiratory system: Chest congestion with minimal wheezing at bases, currently on 4 L nasal cannula Cardiovascular system: S1 & S2 heard, RRR.  Gastrointestinal system: Abdomen is soft Central nervous system: Alert and awake Extremities: No edema Skin: No  significant lesions noted Psychiatry: Flat affect.    Data Reviewed: I have personally reviewed following labs and imaging studies  CBC: Recent Labs  Lab 05/08/21 1901 05/09/21 0434 05/10/21 0542 05/11/21 0446  WBC 12.6* 15.2* 20.0* 13.9*  NEUTROABS 10.1*  --   --   --   HGB 13.1 12.7* 11.8* 12.4*  HCT 40.6 39.1 38.1* 39.6  MCV 94.9 93.5 97.9 95.9  PLT 301 288 307 562   Basic Metabolic Panel: Recent Labs  Lab 05/08/21 1901 05/09/21 0434 05/10/21 0542 05/11/21 0446  NA 136 134* 139 137  K 4.2 4.0  4.4 4.2  CL 100 103 105 104  CO2 29 24 30 28   GLUCOSE 104* 202* 132* 139*  BUN 24* 20 17 16   CREATININE 0.92 0.83 0.93 0.91  CALCIUM 9.0 8.9 8.7* 8.7*  MG  --  2.3 2.1 2.1  PHOS  --  2.6  --   --    GFR: Estimated Creatinine Clearance: 56.5 mL/min (by C-G formula based on SCr of 0.91 mg/dL). Liver Function Tests: Recent Labs  Lab 05/08/21 1901 05/09/21 0434  AST 18 23  ALT 19 22  ALKPHOS 103 104  BILITOT 0.8 0.8  PROT 6.2* 6.1*  ALBUMIN 3.5 3.3*   No results for input(s): LIPASE, AMYLASE in the last 168 hours. No results for input(s): AMMONIA in the last 168 hours. Coagulation Profile: No results for input(s): INR, PROTIME in the last 168 hours. Cardiac Enzymes: No results for input(s): CKTOTAL, CKMB, CKMBINDEX, TROPONINI in the last 168 hours. BNP (last 3 results) No results for input(s): PROBNP in the last 8760 hours. HbA1C: No results for input(s): HGBA1C in the last 72 hours. CBG: No results for input(s): GLUCAP in the last 168 hours. Lipid Profile: No results for input(s): CHOL, HDL, LDLCALC, TRIG, CHOLHDL, LDLDIRECT in the last 72 hours. Thyroid Function Tests: No results for input(s): TSH, T4TOTAL, FREET4, T3FREE, THYROIDAB in the last 72 hours. Anemia Panel: No results for input(s): VITAMINB12, FOLATE, FERRITIN, TIBC, IRON, RETICCTPCT in the last 72 hours. Sepsis Labs: Recent Labs  Lab 05/09/21 0434  PROCALCITON <0.10    Recent Results (from the past 240 hour(s))  Resp Panel by RT-PCR (Flu A&B, Covid) Nasopharyngeal Swab     Status: None   Collection Time: 05/08/21  6:16 PM   Specimen: Nasopharyngeal Swab; Nasopharyngeal(NP) swabs in vial transport medium  Result Value Ref Range Status   SARS Coronavirus 2 by RT PCR NEGATIVE NEGATIVE Final    Comment: (NOTE) SARS-CoV-2 target nucleic acids are NOT DETECTED.  The SARS-CoV-2 RNA is generally detectable in upper respiratory specimens during the acute phase of infection. The lowest concentration of  SARS-CoV-2 viral copies this assay can detect is 138 copies/mL. A negative result does not preclude SARS-Cov-2 infection and should not be used as the sole basis for treatment or other patient management decisions. A negative result may occur with  improper specimen collection/handling, submission of specimen other than nasopharyngeal swab, presence of viral mutation(s) within the areas targeted by this assay, and inadequate number of viral copies(<138 copies/mL). A negative result must be combined with clinical observations, patient history, and epidemiological information. The expected result is Negative.  Fact Sheet for Patients:  EntrepreneurPulse.com.au  Fact Sheet for Healthcare Providers:  IncredibleEmployment.be  This test is no t yet approved or cleared by the [place] FDA and  has been authorized for detection and/or diagnosis of SARS-CoV-2 by FDA under an Emergency Use Authorization (EUA). This EUA will remain  in effect (meaning this test can be used) for the duration of the COVID-19 declaration under Section 564(b)(1) of the Act, 21 U.S.C.section 360bbb-3(b)(1), unless the authorization is terminated  or revoked sooner.       Influenza A by PCR NEGATIVE NEGATIVE Final   Influenza B by PCR NEGATIVE NEGATIVE Final    Comment: (NOTE) The Xpert Xpress SARS-CoV-2/FLU/RSV plus assay is intended as an aid in the diagnosis of influenza from Nasopharyngeal swab specimens and should not be used as a sole basis for treatment. Nasal washings and aspirates are unacceptable for Xpert Xpress SARS-CoV-2/FLU/RSV testing.  Fact Sheet for Patients: EntrepreneurPulse.com.au  Fact Sheet for Healthcare Providers: IncredibleEmployment.be  This test is not yet approved or cleared by the [place] FDA and has been authorized for detection and/or diagnosis of SARS-CoV-2 by FDA under an Emergency Use  Authorization (EUA). This EUA will remain in effect (meaning this test can be used) for the duration of the COVID-19 declaration under Section 564(b)(1) of the Act, 21 U.S.C. section 360bbb-3(b)(1), unless the authorization is terminated or revoked.  Performed at [facility], [address] [place], [place] [address]          Radiology Studies: No results found.      Scheduled Meds:  acetylcysteine  3 mL Nebulization TID   albuterol  2.5 mg/hr Nebulization Once   budesonide (PULMICORT) nebulizer solution  0.25 mg Nebulization BID   dextromethorphan-guaiFENesin  1 tablet Oral BID   enoxaparin (LOVENOX) injection  40 mg Subcutaneous Q24H   finasteride  5 mg Oral Daily   FLUoxetine  20 mg Oral Daily   ipratropium-albuterol  3 mL Nebulization Q6H   methylPREDNISolone (SOLU-MEDROL) injection  60 mg Intravenous Q12H   pantoprazole  40 mg Oral Daily   roflumilast  500 mcg Oral Daily   tamsulosin  0.4 mg Oral Daily   Continuous Infusions:  ampicillin-sulbactam (UNASYN) IV 3 g (05/11/21 0957)     LOS: 2 days    Time spent: 35 minutes    Delane Wessinger Darleen Crocker, DO [facility]  If 7PM-7AM, please contact night-coverage [email or web address] 05/11/2021, 11:46 AM

## 2021-05-11 NOTE — Plan of Care (Signed)

## 2021-05-12 DIAGNOSIS — E440 Moderate protein-calorie malnutrition: Secondary | ICD-10-CM | POA: Insufficient documentation

## 2021-05-12 DIAGNOSIS — J9611 Chronic respiratory failure with hypoxia: Secondary | ICD-10-CM | POA: Diagnosis not present

## 2021-05-12 DIAGNOSIS — J441 Chronic obstructive pulmonary disease with (acute) exacerbation: Secondary | ICD-10-CM | POA: Diagnosis not present

## 2021-05-12 LAB — CBC
HCT: 36.8 % — ABNORMAL LOW (ref 39.0–52.0)
Hemoglobin: 11.5 g/dL — ABNORMAL LOW (ref 13.0–17.0)
MCH: 29.6 pg (ref 26.0–34.0)
MCHC: 31.3 g/dL (ref 30.0–36.0)
MCV: 94.8 fL (ref 80.0–100.0)
Platelets: 324 10*3/uL (ref 150–400)
RBC: 3.88 MIL/uL — ABNORMAL LOW (ref 4.22–5.81)
RDW: 13.1 % (ref 11.5–15.5)
WBC: 13.4 10*3/uL — ABNORMAL HIGH (ref 4.0–10.5)
nRBC: 0 % (ref 0.0–0.2)

## 2021-05-12 LAB — BASIC METABOLIC PANEL
Anion gap: 7 (ref 5–15)
BUN: 18 mg/dL (ref 8–23)
CO2: 29 mmol/L (ref 22–32)
Calcium: 8.7 mg/dL — ABNORMAL LOW (ref 8.9–10.3)
Chloride: 101 mmol/L (ref 98–111)
Creatinine, Ser: 0.92 mg/dL (ref 0.61–1.24)
GFR, Estimated: >60 mL/min (ref >60)
Glucose, Bld: 140 mg/dL — ABNORMAL HIGH (ref 70–99)
Potassium: 3.9 mmol/L (ref 3.5–5.1)
Sodium: 137 mmol/L (ref 135–145)

## 2021-05-12 LAB — MAGNESIUM: Magnesium: 2.2 mg/dL (ref 1.7–2.4)

## 2021-05-12 MED ORDER — TRAZODONE HCL 50 MG PO TABS
50.0000 mg | ORAL_TABLET | Freq: Every evening | ORAL | Status: DC | PRN
  Administered 2021-05-12 – 2021-05-14 (×3): 50 mg via ORAL
  Filled 2021-05-12 (×3): qty 1

## 2021-05-12 MED ORDER — METHYLPREDNISOLONE SODIUM SUCC 125 MG IJ SOLR
60.0000 mg | Freq: Every day | INTRAMUSCULAR | Status: DC
  Filled 2021-05-12: qty 2

## 2021-05-12 NOTE — Progress Notes (Signed)
Name: Ernest Singleton MRN: [id number] DOB: [birth year]    ADMISSION DATE:  05/08/2021 CONSULTATION DATE:  05/12/2021   REFERRING MD :  Lanice Shirts  CHIEF COMPLAINT: Shortness of breath, chest tightness   HISTORY OF PRESENT ILLNESS: 80 year old with gold D COPD, chronic hypoxic respiratory failure, steroid-dependent hospitalized for COPD flare and aspiration pneumonia.  PCCM consulted due to slow improvement.  He is maintained on Symbicort and Spiriva and 10 mg of prednisone.  He has completed pulmonary rehab in the past.  He is maintained on 3 L of oxygen .  He is known to have stable pulmonary nodules largest measuring 1.1 cm on CT chest 07/25/2020, stable since 2017 .he underwent bronchoscopy 02/2019 for chronic right middle lobe consolidation and bronchiectasis.  No mycobacteria was isolated he had a negative PET scan in 2020.  PFTs have shown severe airway obstruction I reviewed his primary pulmonologist notes,dr Michela Pitcher  He was admitted 12/3 with a history of several weeks of shortness of breath and nonproductive cough, presumptive diagnosis was COPD exacerbation.  Influenza and COVID testing was negative.  CBC showed leukocytosis.  Chest x-ray suggested right infrahilar mass/consolidation.  CT chest with contrast showed moderate emphysema, chronic right middle lobe scarring/collapse.  Patchy right lower lobe opacity with associated mucous plugging and bilateral lower lobe airways and aspiration was suggested he was treated with Unasyn and steroids and bronchodilators    SUBJECTIVE: Complains of episodic dyspnea and shortness of breath Cough with minimal sputum production Afebrile  VITAL SIGNS: Temp:  [97.9 F (36.6 C)-98.9 F (37.2 C)] 97.9 F (36.6 C) (12/06 0555) Pulse Rate:  [66-76] 76 (12/06 0555) Resp:  [18-20] 20 (12/06 0555) BP: (103-124)/(52-73) 124/73 (12/06 0555) SpO2:  [96 %-100 %] 98 % (12/06 1103)  PHYSICAL EXAMINATION: Gen. Pleasant, elderly, well-nourished, in no  distress, normal affect ENT - no lesions, no post nasal drip Neck: No JVD, no thyromegaly, no carotid bruits Lungs: Barrel chest, decreased breath sounds bilateral, no rhonchi, no accessory muscle use Cardiovascular: Rhythm regular, heart sounds  normal, no murmurs, no peripheral edema Abdomen: soft and non-tender, no hepatosplenomegaly, BS normal. Musculoskeletal: No deformities, no cyanosis or clubbing Neuro: Alert, interactive, nonfocal, no tremors Skin:  Warm, no lesions/ rash   Recent Labs  Lab 05/10/21 0542 05/11/21 0446 05/12/21 0628  NA 139 137 137  K 4.4 4.2 3.9  CL 105 104 101  CO2 30 28 29   BUN 17 16 18   CREATININE 0.93 0.91 0.92  GLUCOSE 132* 139* 140*    Recent Labs  Lab 05/10/21 0542 05/11/21 0446 05/12/21 0628  HGB 11.8* 12.4* 11.5*  HCT 38.1* 39.6 36.8*  WBC 20.0* 13.9* 13.4*  PLT 307 328 324    No results found.  ASSESSMENT / PLAN:  Right lower lobe pneumonia. -No obvious history to suggest aspiration, low procalcitonin reassuring -Change antibiotic to ceftriaxone and can continue until sputum clears -Await sputum culture to rule out resistant organisms given history of bronchiectasis   Bronchiectasis Chronic middle lobe atelectasis, negative bronchoscopy in the past for NTM -Agree with aggressive airway clearance measures with Mucomyst, chest vest twice daily    Gold D COPD with exacerbation -IV Solu-Medrol can be dropped to 60 mg daily and oral prednisone in 24 hours -Continue budesonide and duo nebs, added Brovana   -Lidoderm patch for pain related to coughing, okay to use Tylenol  - Chronic hypoxic respiratory failure -continue oxygen maintaining saturation 90% and above  PCCM will be available as needed. He  can follow-up with his outpatient pulmonologist dr Michela Pitcher after discharge  Kara Mead MD. [facility]. [place] [facility] Pager [phone] If no response call [phone]    05/12/2021, 1:37 PM

## 2021-05-12 NOTE — Progress Notes (Signed)
PROGRESS NOTE    Ernest Singleton  INO:[id number] DOB: 1940/06/22 DOA: 05/08/2021 PCP: Glenda Chroman, MD   Brief Narrative:   RADEN BYINGTON Singleton is a 80 y.o. male with medical history significant for COPD on 3 LPM at home and tobacco abuse who presents to the emergency department due to several weeks of nonproductive cough and shortness of breath which increasingly worsened within the last 4-days.  Patient was admitted with acute COPD exacerbation that continues to persist.  He was initially thought to have some aspiration pneumonia and was started on IV Unasyn.  He was seen by pulmonology 12/5 and switched over to Rocephin.  Assessment & Plan:   Principal Problem:   Acute exacerbation of chronic obstructive pulmonary disease (COPD) (HCC) Active Problems:   Cigarette smoker   Aspiration pneumonia (HCC)   BPH (benign prostatic hyperplasia)   Chronic respiratory failure with hypoxia (HCC)   Leukocytosis   Dehydration   SIRS (systemic inflammatory response syndrome) (HCC)   Sepsis present on admission secondary to right lower lobe pneumonia -CT demonstrating some right lower lobe aspiration pneumonia with right middle lobe atelectasis/collapse -Procalcitonin low -Continue on Rocephin as prescribed per PCCM   Acute COPD exacerbation with chronic hypoxemia secondary to above -Currently with poor progression; appreciate PCCM ongoing evaluation -Continue steroids with increased solumedrol to 60mg  BID on 12/4 -Continue pulmicort -Added mucomyst and flutter valve along with vibratory vest -Breathing treatments ongoing with Brovana added 12/5 -Wean oxygen to 3 L at baseline   History of ongoing tobacco abuse -Counseled on cessation   BPH -Flomax     DVT prophylaxis: Lovenox Code Status: Full Family Communication: Daughter at bedside 12/5 Disposition Plan:  Status is: Inpatient   Remains inpatient appropriate because: Need for IV medications.       Consultants:  PCCM    Procedures:  None  Antimicrobials:  Anti-infectives (From admission, onward)    Start     Dose/Rate Route Frequency Ordered Stop   05/11/21 1800  cefTRIAXone (ROCEPHIN) 1 g in sodium chloride 0.9 % 100 mL IVPB        1 g 200 mL/hr over 30 Minutes Intravenous Every 24 hours 05/11/21 1331     05/09/21 0230  Ampicillin-Sulbactam (UNASYN) 3 g in sodium chloride 0.9 % 100 mL IVPB  Status:  Discontinued        3 g 200 mL/hr over 30 Minutes Intravenous Every 6 hours 05/09/21 0144 05/11/21 1331   05/08/21 2100  amoxicillin-clavulanate (AUGMENTIN) 875-125 MG per tablet 1 tablet        1 tablet Oral  Once 05/08/21 2053 05/08/21 2113       Subjective: Patient seen and evaluated today with ongoing symptoms of chest congestion with cough and dyspnea with little to no improvement from yesterday.  Objective: Vitals:   05/11/21 2103 05/12/21 0555 05/12/21 0700 05/12/21 1103  BP: (!) 103/55 124/73    Pulse: 67 76    Resp: 18 20    Temp: 98.9 F (37.2 C) 97.9 F (36.6 C)    TempSrc: Oral Oral    SpO2: 96% 100% 99% 98%  Weight:      Height:        Intake/Output Summary (Last 24 hours) at 05/12/2021 1142 Last data filed at 05/12/2021 0900 Gross per 24 hour  Intake 340 ml  Output --  Net 340 ml   Filed Weights   05/08/21 1833 05/09/21 0217  Weight: 77.7 kg 62.7 kg    Examination:  General exam: Appears calm and comfortable  Respiratory system: Ongoing bilateral chest congestion and wheezing. Respiratory effort normal.  Currently on 4 L nasal cannula Cardiovascular system: S1 & S2 heard, RRR.  Gastrointestinal system: Abdomen is soft Central nervous system: Alert and awake Extremities: No edema Skin: No significant lesions noted Psychiatry: Flat affect.    Data Reviewed: I have personally reviewed following labs and imaging studies  CBC: Recent Labs  Lab 05/08/21 1901 05/09/21 0434 05/10/21 0542 05/11/21 0446 05/12/21 0628  WBC 12.6* 15.2* 20.0* 13.9* 13.4*   NEUTROABS 10.1*  --   --   --   --   HGB 13.1 12.7* 11.8* 12.4* 11.5*  HCT 40.6 39.1 38.1* 39.6 36.8*  MCV 94.9 93.5 97.9 95.9 94.8  PLT 301 288 307 328 709   Basic Metabolic Panel: Recent Labs  Lab 05/08/21 1901 05/09/21 0434 05/10/21 0542 05/11/21 0446 05/12/21 0628  NA 136 134* 139 137 137  K 4.2 4.0 4.4 4.2 3.9  CL 100 103 105 104 101  CO2 29 24 30 28 29   GLUCOSE 104* 202* 132* 139* 140*  BUN 24* 20 17 16 18   CREATININE 0.92 0.83 0.93 0.91 0.92  CALCIUM 9.0 8.9 8.7* 8.7* 8.7*  MG  --  2.3 2.1 2.1 2.2  PHOS  --  2.6  --   --   --    GFR: Estimated Creatinine Clearance: 55.8 mL/min (by C-G formula based on SCr of 0.92 mg/dL). Liver Function Tests: Recent Labs  Lab 05/08/21 1901 05/09/21 0434  AST 18 23  ALT 19 22  ALKPHOS 103 104  BILITOT 0.8 0.8  PROT 6.2* 6.1*  ALBUMIN 3.5 3.3*   No results for input(s): LIPASE, AMYLASE in the last 168 hours. No results for input(s): AMMONIA in the last 168 hours. Coagulation Profile: No results for input(s): INR, PROTIME in the last 168 hours. Cardiac Enzymes: No results for input(s): CKTOTAL, CKMB, CKMBINDEX, TROPONINI in the last 168 hours. BNP (last 3 results) No results for input(s): PROBNP in the last 8760 hours. HbA1C: No results for input(s): HGBA1C in the last 72 hours. CBG: No results for input(s): GLUCAP in the last 168 hours. Lipid Profile: No results for input(s): CHOL, HDL, LDLCALC, TRIG, CHOLHDL, LDLDIRECT in the last 72 hours. Thyroid Function Tests: No results for input(s): TSH, T4TOTAL, FREET4, T3FREE, THYROIDAB in the last 72 hours. Anemia Panel: No results for input(s): VITAMINB12, FOLATE, FERRITIN, TIBC, IRON, RETICCTPCT in the last 72 hours. Sepsis Labs: Recent Labs  Lab 05/09/21 0434  PROCALCITON <0.10    Recent Results (from the past 240 hour(s))  Resp Panel by RT-PCR (Flu A&B, Covid) Nasopharyngeal Swab     Status: None   Collection Time: 05/08/21  6:16 PM   Specimen: Nasopharyngeal  Swab; Nasopharyngeal(NP) swabs in vial transport medium  Result Value Ref Range Status   SARS Coronavirus 2 by RT PCR NEGATIVE NEGATIVE Final    Comment: (NOTE) SARS-CoV-2 target nucleic acids are NOT DETECTED.  The SARS-CoV-2 RNA is generally detectable in upper respiratory specimens during the acute phase of infection. The lowest concentration of SARS-CoV-2 viral copies this assay can detect is 138 copies/mL. A negative result does not preclude SARS-Cov-2 infection and should not be used as the sole basis for treatment or other patient management decisions. A negative result may occur with  improper specimen collection/handling, submission of specimen other than nasopharyngeal swab, presence of viral mutation(s) within the areas targeted by this assay, and inadequate number of viral copies(<138  copies/mL). A negative result must be combined with clinical observations, patient history, and epidemiological information. The expected result is Negative.  Fact Sheet for Patients:  EntrepreneurPulse.com.au  Fact Sheet for Healthcare Providers:  IncredibleEmployment.be  This test is no t yet approved or cleared by the [place] FDA and  has been authorized for detection and/or diagnosis of SARS-CoV-2 by FDA under an Emergency Use Authorization (EUA). This EUA will remain  in effect (meaning this test can be used) for the duration of the COVID-19 declaration under Section 564(b)(1) of the Act, 21 U.S.C.section 360bbb-3(b)(1), unless the authorization is terminated  or revoked sooner.       Influenza A by PCR NEGATIVE NEGATIVE Final   Influenza B by PCR NEGATIVE NEGATIVE Final    Comment: (NOTE) The Xpert Xpress SARS-CoV-2/FLU/RSV plus assay is intended as an aid in the diagnosis of influenza from Nasopharyngeal swab specimens and should not be used as a sole basis for treatment. Nasal washings and aspirates are unacceptable for Xpert Xpress  SARS-CoV-2/FLU/RSV testing.  Fact Sheet for Patients: EntrepreneurPulse.com.au  Fact Sheet for Healthcare Providers: IncredibleEmployment.be  This test is not yet approved or cleared by the [place] FDA and has been authorized for detection and/or diagnosis of SARS-CoV-2 by FDA under an Emergency Use Authorization (EUA). This EUA will remain in effect (meaning this test can be used) for the duration of the COVID-19 declaration under Section 564(b)(1) of the Act, 21 U.S.C. section 360bbb-3(b)(1), unless the authorization is terminated or revoked.  Performed at [facility], [address], [place], [place] [address]          Radiology Studies: No results found.      Scheduled Meds:  acetylcysteine  3 mL Nebulization TID   arformoterol  15 mcg Nebulization BID   budesonide (PULMICORT) nebulizer solution  0.25 mg Nebulization BID   dextromethorphan-guaiFENesin  1 tablet Oral BID   enoxaparin (LOVENOX) injection  40 mg Subcutaneous Q24H   feeding supplement  237 mL Oral BID BM   finasteride  5 mg Oral Daily   FLUoxetine  20 mg Oral Daily   lidocaine  1 patch Transdermal Daily   methylPREDNISolone (SOLU-MEDROL) injection  60 mg Intravenous Q12H   multivitamin with minerals  1 tablet Oral Daily   pantoprazole  40 mg Oral Daily   roflumilast  500 mcg Oral Daily   tamsulosin  0.4 mg Oral Daily   Continuous Infusions:  cefTRIAXone (ROCEPHIN)  IV 1 g (05/11/21 1757)     LOS: 3 days    Time spent: 35 minutes    Koden Hunzeker Darleen Crocker, DO [facility]  If 7PM-7AM, please contact night-coverage [email or web address] 05/12/2021, 11:42 AM

## 2021-05-13 DIAGNOSIS — E860 Dehydration: Secondary | ICD-10-CM | POA: Diagnosis not present

## 2021-05-13 DIAGNOSIS — F17210 Nicotine dependence, cigarettes, uncomplicated: Secondary | ICD-10-CM | POA: Diagnosis not present

## 2021-05-13 DIAGNOSIS — J9611 Chronic respiratory failure with hypoxia: Secondary | ICD-10-CM | POA: Diagnosis not present

## 2021-05-13 DIAGNOSIS — J441 Chronic obstructive pulmonary disease with (acute) exacerbation: Secondary | ICD-10-CM | POA: Diagnosis not present

## 2021-05-13 LAB — CBC
HCT: 41.8 % (ref 39.0–52.0)
Hemoglobin: 13.3 g/dL (ref 13.0–17.0)
MCH: 30.3 pg (ref 26.0–34.0)
MCHC: 31.8 g/dL (ref 30.0–36.0)
MCV: 95.2 fL (ref 80.0–100.0)
Platelets: 378 10*3/uL (ref 150–400)
RBC: 4.39 MIL/uL (ref 4.22–5.81)
RDW: 13 % (ref 11.5–15.5)
WBC: 13.5 10*3/uL — ABNORMAL HIGH (ref 4.0–10.5)
nRBC: 0 % (ref 0.0–0.2)

## 2021-05-13 MED ORDER — SALINE SPRAY 0.65 % NA SOLN
1.0000 | NASAL | Status: DC | PRN
  Administered 2021-05-13: 1 via NASAL
  Filled 2021-05-13: qty 44

## 2021-05-13 MED ORDER — DM-GUAIFENESIN ER 30-600 MG PO TB12
2.0000 | ORAL_TABLET | Freq: Two times a day (BID) | ORAL | Status: DC
  Administered 2021-05-13 – 2021-05-15 (×4): 2 via ORAL
  Filled 2021-05-13 (×2): qty 2
  Filled 2021-05-13: qty 1
  Filled 2021-05-13 (×2): qty 2

## 2021-05-13 MED ORDER — PREDNISONE 20 MG PO TABS
60.0000 mg | ORAL_TABLET | Freq: Every day | ORAL | Status: DC
  Administered 2021-05-13 – 2021-05-15 (×3): 60 mg via ORAL
  Filled 2021-05-13 (×3): qty 3

## 2021-05-13 NOTE — Progress Notes (Signed)
PROGRESS NOTE   Ernest Singleton  WVP:[id number] DOB: Sep 28, 1940 DOA: 05/08/2021 PCP: Glenda Chroman, MD   Chief Complaint  Patient presents with   Shortness of Breath   Level of care: Telemetry  Brief Admission History:  80 y.o. male with medical history significant for COPD on 3 LPM at home and tobacco abuse who presents to the emergency department due to several weeks of nonproductive cough and shortness of breath which increasingly worsened within the last 4-days.  Patient was admitted with acute COPD exacerbation that continues to persist.  He was initially thought to have some aspiration pneumonia and was started on IV Unasyn.  He was seen by pulmonology 12/5 and switched over to Rocephin.  He remains very symptomatic.  He was seen by pulmonologist during hospitalization. He is on pulmicort/brovana nebs.    Assessment & Plan:   Principal Problem:   Acute exacerbation of chronic obstructive pulmonary disease (COPD) (HCC) Active Problems:   Cigarette smoker   Aspiration pneumonia (HCC)   BPH (benign prostatic hyperplasia)   Chronic respiratory failure with hypoxia (HCC)   Leukocytosis   Dehydration   SIRS (systemic inflammatory response syndrome) (HCC)   Malnutrition of moderate degree   Sepsis present on admission secondary to right lower lobe pneumonia -Sepsis physiology has resolved now.   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 -Ambulate in anticipation of him going home soon    History of ongoing tobacco  abuse -Counseled on cessation   BPH -Flomax resumed  DVT prophylaxis: enoxaparin  Code Status:  Full  Family Communication: none present during rounds  Disposition: anticipating home in 1-2 days  Status is: Inpatient  Remains inpatient appropriate because: IV abx and IV steroids required   Consultants:  PCCM   Procedures:    Antimicrobials:  Ceftriaxone 12/5>>  Subjective: Pt says he feels terrible today, continues to have SOB.  He continues to cough.    Objective: Vitals:   05/12/21 2028 05/12/21 2156 05/13/21 0457 05/13/21 0737  BP:  129/72 140/82   Pulse: 68 66 77   Resp: 20 20 20    Temp:  97.9 F (36.6 C) 98.3 F (36.8 C)   TempSrc:  Oral Oral   SpO2: 98% 97% 96% 96%  Weight:      Height:        Intake/Output Summary (Last 24 hours) at 05/13/2021 1245 Last data filed at 05/13/2021 0900 Gross per 24 hour  Intake 1200 ml  Output --  Net 1200 ml   Filed Weights   05/08/21 1833 05/09/21 0217  Weight: 77.7 kg 62.7 kg    Examination:  General exam: Appears in mild to moderate respiratory distress. He is tachypneic.   Respiratory system: diffuse wheezing and rales, moderate increased work of breathing.  Cardiovascular system: normal S1 & S2 heard. No JVD, murmurs, rubs, gallops or clicks. No pedal edema. Gastrointestinal system: Abdomen is nondistended, soft and nontender. No organomegaly or masses felt. Normal bowel sounds heard. Central nervous system: Alert and oriented. No focal neurological deficits. Extremities: Symmetric 5 x 5 power. Skin: No rashes,  lesions or ulcers Psychiatry: Judgement and insight appear normal. Mood & affect appropriate.   Data Reviewed: I have personally reviewed following labs and imaging studies  CBC: Recent Labs  Lab 05/08/21 1901 05/09/21 0434 05/10/21 0542 05/11/21 0446 05/12/21 0628 05/13/21 0515  WBC 12.6* 15.2* 20.0* 13.9* 13.4* 13.5*  NEUTROABS 10.1*  --   --   --   --   --   HGB 13.1 12.7* 11.8* 12.4*  11.5* 13.3  HCT 40.6 39.1 38.1* 39.6 36.8* 41.8  MCV 94.9 93.5 97.9 95.9 94.8 95.2  PLT 301 288 307 328 324 188    Basic Metabolic Panel: Recent Labs  Lab 05/08/21 1901 05/09/21 0434 05/10/21 0542 05/11/21 0446 05/12/21 0628  NA 136 134* 139 137 137  K 4.2 4.0 4.4 4.2 3.9  CL 100 103 105 104 101  CO2 29 24 30 28 29   GLUCOSE 104* 202* 132* 139* 140*  BUN 24* 20 17 16 18   CREATININE 0.92 0.83 0.93 0.91 0.92  CALCIUM 9.0 8.9 8.7* 8.7* 8.7*  MG  --  2.3 2.1 2.1 2.2  PHOS  --  2.6  --   --   --     GFR: Estimated Creatinine Clearance: 55.8 mL/min (by C-G formula based on SCr of 0.92 mg/dL).  Liver Function Tests: Recent Labs  Lab 05/08/21 1901 05/09/21 0434  AST 18 23  ALT 19 22  ALKPHOS 103 104  BILITOT 0.8 0.8  PROT 6.2* 6.1*  ALBUMIN 3.5 3.3*    CBG: No results for input(s): GLUCAP in the last 168 hours.  Recent Results (from the past 240 hour(s))  Resp Panel by RT-PCR (Flu A&B, Covid) Nasopharyngeal Swab     Status: None   Collection Time: 05/08/21  6:16 PM   Specimen: Nasopharyngeal Swab; Nasopharyngeal(NP) swabs in vial transport medium  Result Value Ref Range Status   SARS Coronavirus 2 by RT PCR NEGATIVE NEGATIVE Final    Comment: (NOTE) SARS-CoV-2 target nucleic acids are NOT DETECTED.  The SARS-CoV-2 RNA is generally detectable in upper respiratory specimens during the acute phase of infection. The lowest concentration of SARS-CoV-2 viral copies this assay can detect is 138 copies/mL. A negative result does not preclude SARS-Cov-2 infection and should not be used as the sole basis for treatment or other patient management decisions. A negative result may occur with  improper specimen collection/handling, submission of specimen other than nasopharyngeal swab, presence of viral mutation(s) within the areas targeted by this assay, and inadequate number of viral copies(<138 copies/mL). A negative result must be combined with clinical observations,  patient history, and epidemiological information. The expected result is Negative.  Fact Sheet for Patients:  EntrepreneurPulse.com.au  Fact Sheet for Healthcare Providers:  IncredibleEmployment.be  This test is no t yet approved or cleared by the [place] FDA and  has been authorized for detection and/or diagnosis of SARS-CoV-2 by FDA under an Emergency Use Authorization (EUA). This EUA will remain  in effect (meaning this test can be used) for the duration of the COVID-19 declaration under Section 564(b)(1) of the Act, 21 U.S.C.section 360bbb-3(b)(1), unless the authorization is terminated  or revoked sooner.       Influenza A by PCR NEGATIVE NEGATIVE Final   Influenza B by PCR NEGATIVE NEGATIVE Final    Comment: (NOTE) The Xpert Xpress SARS-CoV-2/FLU/RSV plus assay is intended as an aid in the diagnosis of influenza from Nasopharyngeal swab specimens and should not be used as a sole basis for treatment. Nasal washings  and aspirates are unacceptable for Xpert Xpress SARS-CoV-2/FLU/RSV testing.  Fact Sheet for Patients: EntrepreneurPulse.com.au  Fact Sheet for Healthcare Providers: IncredibleEmployment.be  This test is not yet approved or cleared by the [place] FDA and has been authorized for detection and/or diagnosis of SARS-CoV-2 by FDA under an Emergency Use Authorization (EUA). This EUA will remain in effect (meaning this test can be used) for the duration of the COVID-19 declaration under Section 564(b)(1) of the Act, 21 U.S.C. section 360bbb-3(b)(1), unless the authorization is terminated or revoked.  Performed at [facility], [address] [place], [place] [address]      Radiology Studies: No results found.  Scheduled Meds:  acetylcysteine  3 mL Nebulization TID   arformoterol  15 mcg Nebulization BID   budesonide (PULMICORT) nebulizer solution  0.25 mg Nebulization BID    dextromethorphan-guaiFENesin  1 tablet Oral BID   enoxaparin (LOVENOX) injection  40 mg Subcutaneous Q24H   feeding supplement  237 mL Oral BID BM   finasteride  5 mg Oral Daily   FLUoxetine  20 mg Oral Daily   lidocaine  1 patch Transdermal Daily   multivitamin with minerals  1 tablet Oral Daily   pantoprazole  40 mg Oral Daily   predniSONE  60 mg Oral Q breakfast   roflumilast  500 mcg Oral Daily   tamsulosin  0.4 mg Oral Daily   Continuous Infusions:  cefTRIAXone (ROCEPHIN)  IV 1 g (05/12/21 1745)     LOS: 4 days   Time spent: 36 mins   Cathyrn Deas Wynetta Emery, MD How to contact the [facility] Attending or Consulting provider [place] or covering provider during after hours [place], for this patient?  Check the care team in [facility] and look for a) attending/consulting [facility] provider listed and b) the [facility] team listed Log into [email or web address] and use [place]'s universal password to access. If you do not have the password, please contact the hospital operator. Locate the [facility] provider you are looking for under [facility] and page to a number that you can be directly reached. If you still have difficulty reaching the provider, please page the [facility] (Director on Call) for the Hospitalists listed on amion for assistance.  05/13/2021, 12:45 PM

## 2021-05-14 DIAGNOSIS — F17210 Nicotine dependence, cigarettes, uncomplicated: Secondary | ICD-10-CM | POA: Diagnosis not present

## 2021-05-14 DIAGNOSIS — E860 Dehydration: Secondary | ICD-10-CM | POA: Diagnosis not present

## 2021-05-14 DIAGNOSIS — J441 Chronic obstructive pulmonary disease with (acute) exacerbation: Secondary | ICD-10-CM | POA: Diagnosis not present

## 2021-05-14 DIAGNOSIS — J9611 Chronic respiratory failure with hypoxia: Secondary | ICD-10-CM | POA: Diagnosis not present

## 2021-05-14 MED ORDER — IPRATROPIUM-ALBUTEROL 0.5-2.5 (3) MG/3ML IN SOLN
3.0000 mL | Freq: Three times a day (TID) | RESPIRATORY_TRACT | Status: DC
  Administered 2021-05-14 – 2021-05-15 (×2): 3 mL via RESPIRATORY_TRACT
  Filled 2021-05-14 (×2): qty 3

## 2021-05-14 NOTE — Evaluation (Addendum)
Physical Therapy Evaluation Patient Details Name: Ernest Singleton MRN: [id number] DOB: 1940/09/16 Today's Date: 05/14/2021  History of Present Illness  80 y.o. male with medical history significant for COPD on 3 LPM at home and tobacco abuse who presents to the emergency department due to several weeks of nonproductive cough and shortness of breath which increasingly worsened within the last 4-days.  Patient was admitted with acute COPD exacerbation that continues to persist.  He was initially thought to have some aspiration pneumonia and was started on IV Unasyn.  He was seen by pulmonology 12/5 and switched over to Rocephin.  He remains very symptomatic.  He was seen by pulmonologist during hospitalization. He is on pulmicort/brovana nebs.   Clinical Impression    Patient sitting up in bed eating dinner at start of session with son Ernest Singleton. Present. Patient reports that he has been going to the bathroom by himself without any assist. Patient agreeable to therapy though complains of tightness in chest. Vitals assessed at baseline sitting edge of bed while on baseline 3 liters of oxygen and O2 levels between 96-97% saturation. Ambulated without assist or assistive device to end of hall where patient reported feeling short of breath. SpO2 stayed between 94-97% despite symptoms of shortness of breath. Cued patient to breath through nose as he tends to breath through mouth. Ambulated back to room per patient request as he was fatigued. Vitals  reassessed again and SpO2 remained at 95% or higher. Reassured patient that he is getting oxygen and to continue to breath through nose to get supplemental oxygen. Discussed with patient and son how he is currently functioning at baseline and no skilled physical therapy is needed at this time. Patient and family agreed. Patient is discharged from physical therapy to care of nursing for ambulation daily as tolerated for length of stay.     Recommendations for follow  up therapy are one component of a multi-disciplinary discharge planning process, led by the attending physician.  Recommendations may be updated based on patient status, additional functional criteria and insurance authorization.  Follow Up Recommendations No PT follow up    Assistance Recommended at Discharge PRN  Functional Status Assessment    Equipment Recommendations  None recommended by PT    Recommendations for Other Services       Precautions / Restrictions        Mobility  Bed Mobility Overal bed mobility: Modified Independent                  Transfers Overall transfer level: Modified independent                 General transfer comment: able to transition without assist to standing    Ambulation/Gait   Gait Distance (Feet): 120 Feet Assistive device: None         General Gait Details: decreased gait speed, wide base of support, with 3 liters of O2  Stairs            Wheelchair Mobility    Modified Rankin (Stroke Patients Only)       Balance Overall balance assessment: No apparent balance deficits (not formally assessed)                                           Pertinent Vitals/Pain Pain Assessment:  (pain all over in chest - no number provided but  achiness noted)    Home Living Family/patient expects to be discharged to:: Private residence Living Arrangements: Spouse/significant other Available Help at Discharge: Family (son/daughter live near by and can help as needed. Wife recently suffered a CVA and cannot assist patient at home.) Type of Home: House Home Access: Stairs to enter Entrance Stairs-Rails: Right Entrance Stairs-Number of Steps: 3 in garage but level side entry   Home Layout: One level Home Equipment: Conservation officer, nature (2 wheels);Cane - single point;BSC/3in1      Prior Function Prior Level of Function : Independent/Modified Independent             Mobility Comments: ambulates in home  without AD but with 3 LO2- able to go to grocery store but easily winded       Hand Dominance        Extremity/Trunk Assessment        Lower Extremity Assessment Lower Extremity Assessment: Generalized weakness       Communication   Communication: No difficulties  Cognition Arousal/Alertness: Awake/alert Behavior During Therapy: WFL for tasks assessed/performed Overall Cognitive Status: Within Functional Limits for tasks assessed                                          General Comments      Exercises     Assessment/Plan    PT Assessment Patient does not need any further PT services  PT Problem List         PT Treatment Interventions      PT Goals (Current goals can be found in the Care Plan section)  Acute Rehab PT Goals Patient Stated Goal: to go home PT Goal Formulation: With patient Time For Goal Achievement: 05/28/21 Potential to Achieve Goals: Good    Frequency     Barriers to discharge        Co-evaluation               AM-PAC PT "6 Clicks" Mobility  Outcome Measure Help needed turning from your back to your side while in a flat bed without using bedrails?: None Help needed moving from lying on your back to sitting on the side of a flat bed without using bedrails?: None Help needed moving to and from a bed to a chair (including a wheelchair)?: None Help needed standing up from a chair using your arms (e.g., wheelchair or bedside chair)?: None Help needed to walk in hospital room?: None Help needed climbing 3-5 steps with a railing? : A Little 6 Click Score: 23    End of Session Equipment Utilized During Treatment: Gait belt;Oxygen (3 liters) Activity Tolerance: Other (comment) (limited by feelings of shortness of breath) Patient left: in bed;with family/visitor present Nurse Communication: Mobility status PT Visit Diagnosis: Unsteadiness on feet (R26.81);Muscle weakness (generalized) (M62.81)    Time:  9675-9163 PT Time Calculation (min) (ACUTE ONLY): 29 min   Charges:   PT Evaluation $PT Eval Low Complexity: 1 Low PT Treatments $Therapeutic Activity: 8-22 mins      4:40 PM, 05/14/21 Jerene Pitch, DPT Physical Therapy with [facility]  [phone] office

## 2021-05-14 NOTE — Progress Notes (Signed)
Upon assessment it was noted that patient was only on 1L of oxygen instead of 4L. I asked patient if he was SOB but he stated he has been like that for awhile and it was nothing new. Checked o2 sat and it was 97%. Left him on 1L until he did ADLS to see how it his O2 would be and it only went down as low as 93%. Notified RT she said she would do his neb tx and leave o2 off.

## 2021-05-14 NOTE — Progress Notes (Signed)
PROGRESS NOTE   Ernest Singleton  KGY:[id number] DOB: 06/02/1941 DOA: 05/08/2021 PCP: Glenda Chroman, MD   Chief Complaint  Patient presents with   Shortness of Breath   Level of care: Med-Surg  Brief Admission History:  80 y.o. male with medical history significant for COPD on 3 LPM at home and tobacco abuse who presents to the emergency department due to several weeks of nonproductive cough and shortness of breath which increasingly worsened within the last 4-days.  Patient was admitted with acute COPD exacerbation that continues to persist.  He was initially thought to have some aspiration pneumonia and was started on IV Unasyn.  He was seen by pulmonology 12/5 and switched over to Rocephin.  He remains very symptomatic.  He was seen by pulmonologist during hospitalization. He is on pulmicort/brovana nebs.    Assessment & Plan:   Principal Problem:   Acute exacerbation of chronic obstructive pulmonary disease (COPD) (HCC) Active Problems:   Cigarette smoker   Aspiration pneumonia (HCC)   BPH (benign prostatic hyperplasia)   Chronic respiratory failure with hypoxia (HCC)   Leukocytosis   Dehydration   SIRS (systemic inflammatory response syndrome) (HCC)   Malnutrition of moderate degree  Sepsis present on admission secondary to right lower lobe pneumonia -Sepsis physiology has resolved now.   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  His oxygen requirement is coming  down to baseline levels.  -Ambulate in anticipation of him going home soon - PT evaluation requested -If no improvement by tomorrow will ask for palliative care consultation    History of ongoing tobacco abuse -Counseled on cessation   BPH -Flomax resumed  DVT prophylaxis: enoxaparin  Code Status:  Full  Family Communication: daughter at bedside 12/8 Disposition: anticipating home tomorrow  Status is: Inpatient  Remains inpatient appropriate because: IV abx and IV steroids required  Consultants:  PCCM   Procedures:    Antimicrobials:  Ceftriaxone 12/5>>  Subjective: Pt continues to report that he feels terrible and still SOB and doesn't feel he can go home. Daughter is present at bedside.    Objective: Vitals:   05/13/21 2113 05/14/21 0515 05/14/21 0832 05/14/21 1032  BP: 117/70 123/64    Pulse: 87 67    Resp: 19 20    Temp: 98.2 F (36.8 C) 98 F (36.7 C)    TempSrc: Oral     SpO2: 98% 95% 96% 97%  Weight:      Height:        Intake/Output Summary (Last 24 hours) at 05/14/2021 1206 Last data filed at 05/14/2021 0900 Gross per 24 hour  Intake 660 ml  Output --  Net 660 ml   Filed Weights   05/08/21 1833 05/09/21 0217  Weight: 77.7 kg 62.7 kg    Examination:  General exam: Appears in mild to moderate respiratory distress. He is tachypneic.   Respiratory system: diffuse wheezing and rales, moderate increased work of breathing.  Cardiovascular system: normal S1 & S2 heard. No JVD, murmurs, rubs, gallops or clicks. No pedal  edema. Gastrointestinal system: Abdomen is nondistended, soft and nontender. No organomegaly or masses felt. Normal bowel sounds heard. Central nervous system: Alert and oriented. No focal neurological deficits. Extremities: Symmetric 5 x 5 power. Skin: No rashes, lesions or ulcers Psychiatry: Judgement and insight appear normal. Mood & affect appropriate.   Data Reviewed: I have personally reviewed following labs and imaging  studies  CBC: Recent Labs  Lab 05/08/21 1901 05/09/21 0434 05/10/21 0542 05/11/21 0446 05/12/21 0628 05/13/21 0515  WBC 12.6* 15.2* 20.0* 13.9* 13.4* 13.5*  NEUTROABS 10.1*  --   --   --   --   --   HGB 13.1 12.7* 11.8* 12.4* 11.5* 13.3  HCT 40.6 39.1 38.1* 39.6 36.8* 41.8  MCV 94.9 93.5 97.9 95.9 94.8 95.2  PLT 301 288 307 328 324 812    Basic Metabolic Panel: Recent Labs  Lab 05/08/21 1901 05/09/21 0434 05/10/21 0542 05/11/21 0446 05/12/21 0628  NA 136 134* 139 137 137  K 4.2 4.0 4.4 4.2 3.9  CL 100 103 105 104 101  CO2 29 24 30 28 29   GLUCOSE 104* 202* 132* 139* 140*  BUN 24* 20 17 16 18   CREATININE 0.92 0.83 0.93 0.91 0.92  CALCIUM 9.0 8.9 8.7* 8.7* 8.7*  MG  --  2.3 2.1 2.1 2.2  PHOS  --  2.6  --   --   --     GFR: Estimated Creatinine Clearance: 55.8 mL/min (by C-G formula based on SCr of 0.92 mg/dL).  Liver Function Tests: Recent Labs  Lab 05/08/21 1901 05/09/21 0434  AST 18 23  ALT 19 22  ALKPHOS 103 104  BILITOT 0.8 0.8  PROT 6.2* 6.1*  ALBUMIN 3.5 3.3*    CBG: No results for input(s): GLUCAP in the last 168 hours.  Recent Results (from the past 240 hour(s))  Resp Panel by RT-PCR (Flu A&B, Covid) Nasopharyngeal Swab     Status: None   Collection Time: 05/08/21  6:16 PM   Specimen: Nasopharyngeal Swab; Nasopharyngeal(NP) swabs in vial transport medium  Result Value Ref Range Status   SARS Coronavirus 2 by RT PCR NEGATIVE NEGATIVE Final    Comment: (NOTE) SARS-CoV-2 target nucleic acids are NOT DETECTED.  The SARS-CoV-2 RNA is generally detectable in upper respiratory specimens during the acute phase of infection. The lowest concentration of SARS-CoV-2 viral copies this assay can detect is 138 copies/mL. A negative result does not preclude SARS-Cov-2 infection and should not be used as the sole basis for treatment or other patient management decisions. A negative result may occur with  improper specimen collection/handling, submission of  specimen other than nasopharyngeal swab, presence of viral mutation(s) within the areas targeted by this assay, and inadequate number of viral copies(<138 copies/mL). A negative result must be combined with clinical observations, patient history, and epidemiological information. The expected result is Negative.  Fact Sheet for Patients:  EntrepreneurPulse.com.au  Fact Sheet for Healthcare Providers:  IncredibleEmployment.be  This test is no t yet approved or cleared by the [place] FDA and  has been authorized for detection and/or diagnosis of SARS-CoV-2 by FDA under an Emergency Use Authorization (EUA). This EUA will remain  in effect (meaning this test can be used) for the duration of the COVID-19 declaration under Section 564(b)(1) of the Act, 21 U.S.C.section 360bbb-3(b)(1), unless the authorization is terminated  or revoked sooner.       Influenza A by PCR NEGATIVE NEGATIVE Final   Influenza B by PCR NEGATIVE NEGATIVE Final  Comment: (NOTE) The Xpert Xpress SARS-CoV-2/FLU/RSV plus assay is intended as an aid in the diagnosis of influenza from Nasopharyngeal swab specimens and should not be used as a sole basis for treatment. Nasal washings and aspirates are unacceptable for Xpert Xpress SARS-CoV-2/FLU/RSV testing.  Fact Sheet for Patients: EntrepreneurPulse.com.au  Fact Sheet for Healthcare Providers: IncredibleEmployment.be  This test is not yet approved or cleared by the [place] FDA and has been authorized for detection and/or diagnosis of SARS-CoV-2 by FDA under an Emergency Use Authorization (EUA). This EUA will remain in effect (meaning this test can be used) for the duration of the COVID-19 declaration under Section 564(b)(1) of the Act, 21 U.S.C. section 360bbb-3(b)(1), unless the authorization is terminated or revoked.  Performed at [facility], [address]  [place], [place] [address]      Radiology Studies: No results found.  Scheduled Meds:  acetylcysteine  3 mL Nebulization TID   arformoterol  15 mcg Nebulization BID   budesonide (PULMICORT) nebulizer solution  0.25 mg Nebulization BID   dextromethorphan-guaiFENesin  2 tablet Oral BID   enoxaparin (LOVENOX) injection  40 mg Subcutaneous Q24H   feeding supplement  237 mL Oral BID BM   finasteride  5 mg Oral Daily   FLUoxetine  20 mg Oral Daily   lidocaine  1 patch Transdermal Daily   multivitamin with minerals  1 tablet Oral Daily   pantoprazole  40 mg Oral Daily   predniSONE  60 mg Oral Q breakfast   roflumilast  500 mcg Oral Daily   tamsulosin  0.4 mg Oral Daily   Continuous Infusions:  cefTRIAXone (ROCEPHIN)  IV 1 g (05/13/21 1745)    LOS: 5 days   Time spent: 34 mins   Lillyanna Glandon Wynetta Emery, MD How to contact the [facility] Attending or Consulting provider [place] or covering provider during after hours [place], for this patient?  Check the care team in [facility] and look for a) attending/consulting [facility] provider listed and b) the [facility] team listed Log into [email or web address] and use [place]'s universal password to access. If you do not have the password, please contact the hospital operator. Locate the [facility] provider you are looking for under [facility] and page to a number that you can be directly reached. If you still have difficulty reaching the provider, please page the [facility] (Director on Call) for the Hospitalists listed on amion for assistance.  05/14/2021, 12:06 PM

## 2021-05-14 NOTE — Progress Notes (Signed)
  Transition of Care [facility]) Screening Note   Patient Details  Name: Ernest Singleton Date of Birth: April 18, 1941   Transition of Care [facility]) CM/SW Contact:    Ihor Gully, LCSW Phone Number: 05/14/2021, 12:20 PM    Transition of Care Department [facility]) has reviewed patient and no TOC needs have been identified at this time. We will continue to monitor patient advancement through interdisciplinary progression rounds. If new patient transition needs arise, please place a TOC consult.

## 2021-05-15 MED ORDER — PREDNISONE 20 MG PO TABS: ORAL_TABLET | ORAL | 0 refills | Status: AC

## 2021-05-15 MED ORDER — IPRATROPIUM-ALBUTEROL 0.5-2.5 (3) MG/3ML IN SOLN: 3.0000 mL | RESPIRATORY_TRACT | 1 refills | Status: AC | PRN

## 2021-05-15 MED ORDER — PANTOPRAZOLE SODIUM 40 MG PO TBEC: 40.0000 mg | DELAYED_RELEASE_TABLET | Freq: Every day | ORAL | 0 refills | Status: AC

## 2021-05-15 MED ORDER — GUAIFENESIN ER 600 MG PO TB12: 1200.0000 mg | ORAL_TABLET | Freq: Two times a day (BID) | ORAL | 0 refills | Status: AC

## 2021-05-15 MED ORDER — CEFDINIR 300 MG PO CAPS: 300.0000 mg | ORAL_CAPSULE | Freq: Two times a day (BID) | ORAL | 0 refills | Status: AC

## 2021-05-15 NOTE — Discharge Instructions (Signed)
IMPORTANT INFORMATION: PAY CLOSE ATTENTION   PHYSICIAN DISCHARGE INSTRUCTIONS  Follow with Primary care provider  Vyas, Dhruv B, MD  and other consultants as instructed by your Hospitalist Physician  SEEK MEDICAL CARE OR RETURN TO EMERGENCY ROOM IF SYMPTOMS COME BACK, WORSEN OR NEW PROBLEM DEVELOPS   Please note: You were cared for by a hospitalist during your hospital stay. Every effort will be made to forward records to your primary care provider.  You can request that your primary care provider send for your hospital records if they have not received them.  Once you are discharged, your primary care physician will handle any further medical issues. Please note that NO REFILLS for any discharge medications will be authorized once you are discharged, as it is imperative that you return to your primary care physician (or establish a relationship with a primary care physician if you do not have one) for your post hospital discharge needs so that they can reassess your need for medications and monitor your lab values.  Please get a complete blood count and chemistry panel checked by your Primary MD at your next visit, and again as instructed by your Primary MD.  Get Medicines reviewed and adjusted: Please take all your medications with you for your next visit with your Primary MD  Laboratory/radiological data: Please request your Primary MD to go over all hospital tests and procedure/radiological results at the follow up, please ask your primary care provider to get all Hospital records sent to his/her office.  In some cases, they will be blood work, cultures and biopsy results pending at the time of your discharge. Please request that your primary care provider follow up on these results.  If you are diabetic, please bring your blood sugar readings with you to your follow up appointment with primary care.    Please call and make your follow up appointments as soon as possible.    Also Note  the following: If you experience worsening of your admission symptoms, develop shortness of breath, life threatening emergency, suicidal or homicidal thoughts you must seek medical attention immediately by calling 911 or calling your MD immediately  if symptoms less severe.  You must read complete instructions/literature along with all the possible adverse reactions/side effects for all the Medicines you take and that have been prescribed to you. Take any new Medicines after you have completely understood and accpet all the possible adverse reactions/side effects.   Do not drive when taking Pain medications or sleeping medications (Benzodiazepines)  Do not take more than prescribed Pain, Sleep and Anxiety Medications. It is not advisable to combine anxiety,sleep and pain medications without talking with your primary care practitioner  Special Instructions: If you have smoked or chewed Tobacco  in the last 2 yrs please stop smoking, stop any regular Alcohol  and or any Recreational drug use.  Wear Seat belts while driving.  Do not drive if taking any narcotic, mind altering or controlled substances or recreational drugs or alcohol.       

## 2021-05-15 NOTE — Progress Notes (Signed)
Ernest Singleton to be D/C'd Home per MD order.  Discussed with the patient and all questions fully answered.  VSS, Skin clean, dry and intact without evidence of skin break down, no evidence of skin tears noted. IV catheter discontinued intact. Site without signs and symptoms of complications. Dressing and pressure applied.  An After Visit Summary was printed and given to the patient. Patient prescriptions sent to pharmacy.  D/c education completed with patient/family including follow up instructions, medication list, d/c activities limitations if indicated, with other d/c instructions as indicated by MD - patient able to verbalize understanding, all questions fully answered.   Patient instructed to return to ED, call 911, or call MD for any changes in condition.   Patient escorted via [place], and D/C home via private auto.  [name] 05/15/2021 1:11 PM

## 2021-05-15 NOTE — Discharge Summary (Signed)
Physician Discharge Summary  Ernest Singleton SFK:[id number] DOB: [birth year] DOA: 05/08/2021  PCP: Glenda Chroman, MD Pulmonologist: Dr. Ave Filter in [place], [place] date: 05/08/2021 Discharge date: 05/15/2021  Admitted From:  HOME  Disposition: [place]  Recommendations for Outpatient Follow-up:  Follow up with PCP in 1 weeks Please follow up with pulmonologist in 1-2 weeks as soon as able Please continue ongoing palliative care discussions  Home Health:  HOME HOSPICE  Discharge Condition: HOSPICE   CODE STATUS: FULL DIET:  resume prior home diet    Brief Hospitalization Summary: Please see all hospital notes, images, labs for full details of the hospitalization. ADMISSION HPI: Ernest Singleton is a 80 y.o. male with medical history significant for COPD on 3 LPM at home and tobacco abuse who presents to the emergency department due to several weeks of nonproductive cough and shortness of breath which increasingly worsened within the last 4-days.  He complained of chest congestion with difficulty in being able to cough up any sputum and worsening shortness of breath to the extent that he could barely go from 1 room to another in his house without having to stop to rest due to shortness of breath (this was new from baseline).  He denies fever, chills, headache, nausea, vomiting, chest pain.  Patient continues to smoke with last smoking being today (1 cigarette).   ED Course:  In the emergency department, he was tachypneic and tachycardic.  ABG showed hypoxia, work-up in the ED showed normal CBC except for leukocytosis and normal BMP except for elevated BUN at 24.  Troponin x2 was negative.  Influenza A, B, SARS coronavirus 2 was negative. CT chest with contrast was suggestive of lower lobe aspiration with mild right lower lobe pneumonia.  Associated right middle lobe atelectasis/collapse.  Chest x-ray showed right infrahilar density which could be a mass  lesion.  Patient was started on Augmentin, breathing treatment was provided, Solu-Medrol 125 mg x 1 and Ativan was given.  Hospitalist was asked to admit patient for further evaluation and management.    HOSPITAL COURSE  Sepsis present on admission secondary to right lower lobe pneumonia -Sepsis physiology has resolved now.   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  -Ambulated well with PT evaluation requested -We have gotten him the best that we can get him in the hospital, he is at end stages of disease.  I spoke to him about palliative care and home hospice services.  He is agreeable to receiving home  hospice services and ongoing palliative care discussions.  He requested [facility] service for home.  DC home today.  He says he will follow up with his pulmonologist in [place], [place].      History of ongoing tobacco abuse -Counseled on cessation   BPH -Flomax resumed   DVT prophylaxis: enoxaparin  Code Status:  Full  Family Communication:  daughter at bedside 12/8, 12/9 Disposition: [place]    Discharge Diagnoses:  Principal Problem:   Acute exacerbation of chronic obstructive pulmonary disease (COPD) ([place]) Active Problems:   Cigarette smoker   Aspiration pneumonia (HCC)   BPH (benign prostatic hyperplasia)    Chronic respiratory failure with hypoxia (HCC)   Leukocytosis   Dehydration   SIRS (systemic inflammatory response syndrome) (HCC)   Malnutrition of moderate degree  Discharge Instructions:  Allergies as of 05/15/2021       Reactions   Requip [ropinirole Hcl]    Dizziness        Medication List     STOP taking these medications    aspirin 81 MG tablet   atorvastatin 10 MG tablet Commonly known as: LIPITOR   Azelastine-Fluticasone 137-50 MCG/ACT Susp   celecoxib 200 MG capsule Commonly known as: CELEBREX   FLUoxetine 20 MG capsule Commonly known as: PROZAC   varenicline 0.5 MG X 11 & 1 MG X 42 tablet Commonly known as: CHANTIX PAK       TAKE these medications    cefdinir 300 MG capsule Commonly known as: OMNICEF Take 1 capsule (300 mg total) by mouth 2 (two) times daily for 4 days.   cetirizine 10 MG chewable tablet Commonly known as: ZYRTEC Chew 10 mg by mouth daily.   finasteride 5 MG tablet Commonly known as: PROSCAR Take 5 mg by mouth daily.   guaiFENesin 600 MG 12 hr tablet Commonly known as: Mucinex Take 2 tablets (1,200 mg total) by mouth 2 (two) times daily for 5 days.   ipratropium-albuterol 0.5-2.5 (3) MG/3ML Soln Commonly known as: DUONEB Take 3 mLs by nebulization every 4 (four) hours as needed (wheezing, shortness of breath, cough).   pantoprazole 40 MG tablet Commonly known as: PROTONIX Take 1 tablet (40 mg total) by mouth daily. Start taking on: May 16, 2021   predniSONE 20 MG tablet Commonly known as: DELTASONE Take 3 PO QAM x5days, 2 PO QAM x5days, 1 PO QAM x5days Start taking on: May 16, 2021 What changed:  medication strength additional instructions   roflumilast 500 MCG Tabs tablet Commonly known as: DALIRESP Take 500 mcg by mouth daily as needed.   Spiriva Respimat 2.5 MCG/ACT Aers Generic drug: Tiotropium Bromide Monohydrate Inhale 2 puffs into the lungs daily.   Symbicort 160-4.5 MCG/ACT  inhaler Generic drug: budesonide-formoterol INHALE 2 PUFFS EVERY 12 HOURS What changed: See the new instructions.   tamsulosin 0.4 MG Caps capsule Commonly known as: FLOMAX Take 0.4 mg by mouth daily.   Ventolin HFA 108 (90 Base) MCG/ACT inhaler Generic drug: albuterol USE 2 PUFFS EVERY 4 TO 6 HOURS AS NEEDED   Vitamin D-3 25 MCG (1000 UT) Caps Take 1,000 Units by mouth daily.        Follow-up Information     Vyas, Dhruv B, MD. Schedule an appointment as soon as possible for a visit in 1 week(s).   Specialty: Internal Medicine Why: Hospital Follow Up Contact information: [place] [address] [phone]         Dionne Milo, MD. Schedule an appointment as soon as possible for a visit in 1 week(s).   Specialty: Pulmonary Disease Why: Hospital Follow Up Contact information: [address] [place] [address] [phone]                Allergies  Allergen Reactions   Requip [Ropinirole Hcl]     Dizziness  Allergies as of 05/15/2021       Reactions   Requip [ropinirole Hcl]    Dizziness        Medication List     STOP taking these medications    aspirin 81 MG tablet   atorvastatin 10 MG tablet Commonly known as: LIPITOR   Azelastine-Fluticasone 137-50 MCG/ACT Susp   celecoxib 200 MG capsule Commonly known as: CELEBREX   FLUoxetine 20 MG capsule Commonly known as: PROZAC   varenicline 0.5 MG X 11 & 1 MG X 42 tablet Commonly known as: CHANTIX PAK       TAKE these medications    cefdinir 300 MG capsule Commonly known as: OMNICEF Take 1 capsule (300 mg total) by mouth 2 (two) times daily for 4 days.   cetirizine 10 MG chewable tablet Commonly known as: ZYRTEC Chew 10 mg by mouth daily.   finasteride 5 MG tablet Commonly known as: PROSCAR Take 5 mg by mouth daily.   guaiFENesin 600 MG 12 hr tablet Commonly known as: Mucinex Take 2 tablets (1,200 mg total) by mouth 2 (two) times daily for 5  days.   ipratropium-albuterol 0.5-2.5 (3) MG/3ML Soln Commonly known as: DUONEB Take 3 mLs by nebulization every 4 (four) hours as needed (wheezing, shortness of breath, cough).   pantoprazole 40 MG tablet Commonly known as: PROTONIX Take 1 tablet (40 mg total) by mouth daily. Start taking on: May 16, 2021   predniSONE 20 MG tablet Commonly known as: DELTASONE Take 3 PO QAM x5days, 2 PO QAM x5days, 1 PO QAM x5days Start taking on: May 16, 2021 What changed:  medication strength additional instructions   roflumilast 500 MCG Tabs tablet Commonly known as: DALIRESP Take 500 mcg by mouth daily as needed.   Spiriva Respimat 2.5 MCG/ACT Aers Generic drug: Tiotropium Bromide Monohydrate Inhale 2 puffs into the lungs daily.   Symbicort 160-4.5 MCG/ACT inhaler Generic drug: budesonide-formoterol INHALE 2 PUFFS EVERY 12 HOURS What changed: See the new instructions.   tamsulosin 0.4 MG Caps capsule Commonly known as: FLOMAX Take 0.4 mg by mouth daily.   Ventolin HFA 108 (90 Base) MCG/ACT inhaler Generic drug: albuterol USE 2 PUFFS EVERY 4 TO 6 HOURS AS NEEDED   Vitamin D-3 25 MCG (1000 UT) Caps Take 1,000 Units by mouth daily.        Procedures/Studies: CT Chest W Contrast  Result Date: 05/09/2021 CLINICAL DATA:  Abnormal chest radiograph, shortness of breath EXAM: CT CHEST WITH CONTRAST TECHNIQUE: Multidetector CT imaging of the chest was performed during intravenous contrast administration. CONTRAST:  144mL OMNIPAQUE IOHEXOL 300 MG/ML  SOLN COMPARISON:  Chest radiograph dated 05/08/2021 FINDINGS: Cardiovascular: The heart is normal in size. No pericardial effusion. No evidence of thoracic aortic aneurysm. Atherosclerotic calcifications of the aortic arch. Coronary atherosclerosis of the LAD and right coronary artery. Mediastinum/Nodes: No suspicious mediastinal lymphadenopathy. 7 mm left thyroid nodule, benign. Not clinically significant; no follow-up imaging  recommended (ref: J Am Coll Radiol. 2015 Feb;12(2): 143-50). Lungs/Pleura: Moderate centrilobular and paraseptal emphysematous changes, upper lung predominant. Right middle lobe scarring/atelectasis with collapse. Mild patchy right lower lobe opacity (series 4/image 139), suggesting mild infection/pneumonia. Associated peribronchial thickening/mucous plugging in the bilateral lower lobe airways, suggesting aspiration is the underlying etiology. No suspicious pulmonary nodules. No pleural effusion or pneumothorax. Upper Abdomen: Visualized upper abdomen is grossly unremarkable, noting motion degradation. Musculoskeletal: Mild degenerative changes of the mid/lower thoracic spine. IMPRESSION: Suspected lower lobe aspiration with mild right lower lobe pneumonia. Associated right middle lobe  atelectasis/collapse. Aortic Atherosclerosis (ICD10-I70.0) and Emphysema (ICD10-J43.9). Electronically Signed   By: Julian Hy M.D.   On: 05/09/2021 00:07   DG Chest Portable 1 View  Result Date: 05/08/2021 CLINICAL DATA:  Cough and short of breath EXAM: PORTABLE CHEST 1 VIEW COMPARISON:  12/25/2014 FINDINGS: Heart size and vascularity normal.  Negative for heart failure. COPD with pulmonary hyperinflation Abnormal density right inferior hilar region. This may be within the right middle lobe or right lower lobe. Possible atelectasis or mass lesion. No pleural effusion. IMPRESSION: Right infrahilar density which could be a mass lesion. Recommend follow-up CT chest with contrast COPD Electronically Signed   By: Franchot Gallo M.D.   On: 05/08/2021 18:59     Subjective: Pt remains short of breath but has been ambulating well with PT, he is agreeable to receiving home hospice services.    Discharge Exam: Vitals:   05/15/21 0524 05/15/21 0804  BP: 129/70   Pulse: 67   Resp: 19   Temp: 98.2 F (36.8 C)   SpO2: 99% 94%   Vitals:   05/14/21 2033 05/14/21 2126 05/15/21 0524 05/15/21 0804  BP:  128/73 129/70    Pulse:  80 67   Resp:  20 19   Temp:  98.3 F (36.8 C) 98.2 F (36.8 C)   TempSrc:  Oral Oral   SpO2: 96% 96% 99% 94%  Weight:      Height:       General: Pt is alert, awake, not in acute distress Cardiovascular: normal S1/S2 +, no rubs, no gallops Respiratory: better air movement today, expiratory wheeze at bases, no rhonchi Abdominal: Soft, NT, ND, bowel sounds + Extremities: no edema, no cyanosis   The results of significant diagnostics from this hospitalization (including imaging, microbiology, ancillary and laboratory) are listed below for reference.     Microbiology: Recent Results (from the past 240 hour(s))  Resp Panel by RT-PCR (Flu A&B, Covid) Nasopharyngeal Swab     Status: None   Collection Time: 05/08/21  6:16 PM   Specimen: Nasopharyngeal Swab; Nasopharyngeal(NP) swabs in vial transport medium  Result Value Ref Range Status   SARS Coronavirus 2 by RT PCR NEGATIVE NEGATIVE Final    Comment: (NOTE) SARS-CoV-2 target nucleic acids are NOT DETECTED.  The SARS-CoV-2 RNA is generally detectable in upper respiratory specimens during the acute phase of infection. The lowest concentration of SARS-CoV-2 viral copies this assay can detect is 138 copies/mL. A negative result does not preclude SARS-Cov-2 infection and should not be used as the sole basis for treatment or other patient management decisions. A negative result may occur with  improper specimen collection/handling, submission of specimen other than nasopharyngeal swab, presence of viral mutation(s) within the areas targeted by this assay, and inadequate number of viral copies(<138 copies/mL). A negative result must be combined with clinical observations, patient history, and epidemiological information. The expected result is Negative.  Fact Sheet for Patients:  EntrepreneurPulse.com.au  Fact Sheet for Healthcare Providers:  IncredibleEmployment.be  This test is no t  yet approved or cleared by the [place] FDA and  has been authorized for detection and/or diagnosis of SARS-CoV-2 by FDA under an Emergency Use Authorization (EUA). This EUA will remain  in effect (meaning this test can be used) for the duration of the COVID-19 declaration under Section 564(b)(1) of the Act, 21 U.S.C.section 360bbb-3(b)(1), unless the authorization is terminated  or revoked sooner.       Influenza A by PCR NEGATIVE NEGATIVE Final   Influenza  B by PCR NEGATIVE NEGATIVE Final    Comment: (NOTE) The Xpert Xpress SARS-CoV-2/FLU/RSV plus assay is intended as an aid in the diagnosis of influenza from Nasopharyngeal swab specimens and should not be used as a sole basis for treatment. Nasal washings and aspirates are unacceptable for Xpert Xpress SARS-CoV-2/FLU/RSV testing.  Fact Sheet for Patients: EntrepreneurPulse.com.au  Fact Sheet for Healthcare Providers: IncredibleEmployment.be  This test is not yet approved or cleared by the [place] FDA and has been authorized for detection and/or diagnosis of SARS-CoV-2 by FDA under an Emergency Use Authorization (EUA). This EUA will remain in effect (meaning this test can be used) for the duration of the COVID-19 declaration under Section 564(b)(1) of the Act, 21 U.S.C. section 360bbb-3(b)(1), unless the authorization is terminated or revoked.  Performed at [facility], [address], [place], [place] [address]      Labs: BNP (last 3 results) No results for input(s): BNP in the last 8760 hours. Basic Metabolic Panel: Recent Labs  Lab 05/08/21 1901 05/09/21 0434 05/10/21 0542 05/11/21 0446 05/12/21 0628  NA 136 134* 139 137 137  K 4.2 4.0 4.4 4.2 3.9  CL 100 103 105 104 101  CO2 29 24 30 28 29   GLUCOSE 104* 202* 132* 139* 140*  BUN 24* 20 17 16 18   CREATININE 0.92 0.83 0.93 0.91 0.92  CALCIUM 9.0 8.9 8.7* 8.7* 8.7*  MG  --  2.3 2.1 2.1 2.2  PHOS  --  2.6  --   --    --    Liver Function Tests: Recent Labs  Lab 05/08/21 1901 05/09/21 0434  AST 18 23  ALT 19 22  ALKPHOS 103 104  BILITOT 0.8 0.8  PROT 6.2* 6.1*  ALBUMIN 3.5 3.3*   No results for input(s): LIPASE, AMYLASE in the last 168 hours. No results for input(s): AMMONIA in the last 168 hours. CBC: Recent Labs  Lab 05/08/21 1901 05/09/21 0434 05/10/21 0542 05/11/21 0446 05/12/21 0628 05/13/21 0515  WBC 12.6* 15.2* 20.0* 13.9* 13.4* 13.5*  NEUTROABS 10.1*  --   --   --   --   --   HGB 13.1 12.7* 11.8* 12.4* 11.5* 13.3  HCT 40.6 39.1 38.1* 39.6 36.8* 41.8  MCV 94.9 93.5 97.9 95.9 94.8 95.2  PLT 301 288 307 328 324 378   Cardiac Enzymes: No results for input(s): CKTOTAL, CKMB, CKMBINDEX, TROPONINI in the last 168 hours. BNP: Invalid input(s): POCBNP CBG: No results for input(s): GLUCAP in the last 168 hours. D-Dimer No results for input(s): DDIMER in the last 72 hours. Hgb A1c No results for input(s): HGBA1C in the last 72 hours. Lipid Profile No results for input(s): CHOL, HDL, LDLCALC, TRIG, CHOLHDL, LDLDIRECT in the last 72 hours. Thyroid function studies No results for input(s): TSH, T4TOTAL, T3FREE, THYROIDAB in the last 72 hours.  Invalid input(s): FREET3 Anemia work up No results for input(s): VITAMINB12, FOLATE, FERRITIN, TIBC, IRON, RETICCTPCT in the last 72 hours. Urinalysis No results found for: COLORURINE, APPEARANCEUR, [place], [place], GLUCOSEU, [place], [place], [place], PROTEINUR, UROBILINOGEN, NITRITE, LEUKOCYTESUR Sepsis Labs Invalid input(s): PROCALCITONIN,  WBC,  LACTICIDVEN Microbiology Recent Results (from the past 240 hour(s))  Resp Panel by RT-PCR (Flu A&B, Covid) Nasopharyngeal Swab     Status: None   Collection Time: 05/08/21  6:16 PM   Specimen: Nasopharyngeal Swab; Nasopharyngeal(NP) swabs in vial transport medium  Result Value Ref Range Status   SARS Coronavirus 2 by RT PCR NEGATIVE NEGATIVE Final    Comment: (NOTE)  SARS-CoV-2 target  nucleic acids are NOT DETECTED.  The SARS-CoV-2 RNA is generally detectable in upper respiratory specimens during the acute phase of infection. The lowest concentration of SARS-CoV-2 viral copies this assay can detect is 138 copies/mL. A negative result does not preclude SARS-Cov-2 infection and should not be used as the sole basis for treatment or other patient management decisions. A negative result may occur with  improper specimen collection/handling, submission of specimen other than nasopharyngeal swab, presence of viral mutation(s) within the areas targeted by this assay, and inadequate number of viral copies(<138 copies/mL). A negative result must be combined with clinical observations, patient history, and epidemiological information. The expected result is Negative.  Fact Sheet for Patients:  EntrepreneurPulse.com.au  Fact Sheet for Healthcare Providers:  IncredibleEmployment.be  This test is no t yet approved or cleared by the [place] FDA and  has been authorized for detection and/or diagnosis of SARS-CoV-2 by FDA under an Emergency Use Authorization (EUA). This EUA will remain  in effect (meaning this test can be used) for the duration of the COVID-19 declaration under Section 564(b)(1) of the Act, 21 U.S.C.section 360bbb-3(b)(1), unless the authorization is terminated  or revoked sooner.       Influenza A by PCR NEGATIVE NEGATIVE Final   Influenza B by PCR NEGATIVE NEGATIVE Final    Comment: (NOTE) The Xpert Xpress SARS-CoV-2/FLU/RSV plus assay is intended as an aid in the diagnosis of influenza from Nasopharyngeal swab specimens and should not be used as a sole basis for treatment. Nasal washings and aspirates are unacceptable for Xpert Xpress SARS-CoV-2/FLU/RSV testing.  Fact Sheet for Patients: EntrepreneurPulse.com.au  Fact Sheet for Healthcare  Providers: IncredibleEmployment.be  This test is not yet approved or cleared by the [place] FDA and has been authorized for detection and/or diagnosis of SARS-CoV-2 by FDA under an Emergency Use Authorization (EUA). This EUA will remain in effect (meaning this test can be used) for the duration of the COVID-19 declaration under Section 564(b)(1) of the Act, 21 U.S.C. section 360bbb-3(b)(1), unless the authorization is terminated or revoked.  Performed at [facility], [address], [place], [place] [address]    Time coordinating discharge: 40 mins   SIGNED:  Irwin Brakeman, MD  [facility] 05/15/2021, 11:00 AM How to contact the [facility] Attending or Consulting provider [place] or covering provider during after hours [place], for this patient?  Check the care team in [facility] and look for a) attending/consulting [facility] provider listed and b) the [facility] team listed Log into [email or web address] and use [place] universal password to access. If you do not have the password, please contact the hospital operator. Locate the [facility] provider you are looking for under [facility] and page to a number that you can be directly reached. If you still have difficulty reaching the provider, please page the [facility] (Director on Call) for the Hospitalists listed on amion for assistance.

## 2021-05-15 NOTE — Progress Notes (Signed)
Per progression rounds, patient referred to [name] for ongoing hospice services in the home.    Ernest Singleton, Clydene Pugh, LCSW

## 2021-05-18 DIAGNOSIS — J9611 Chronic respiratory failure with hypoxia: Secondary | ICD-10-CM | POA: Diagnosis not present

## 2021-05-18 DIAGNOSIS — J449 Chronic obstructive pulmonary disease, unspecified: Secondary | ICD-10-CM | POA: Diagnosis not present

## 2021-05-25 DIAGNOSIS — Z299 Encounter for prophylactic measures, unspecified: Secondary | ICD-10-CM | POA: Diagnosis not present

## 2021-05-25 DIAGNOSIS — M1990 Unspecified osteoarthritis, unspecified site: Secondary | ICD-10-CM | POA: Diagnosis not present

## 2021-05-25 DIAGNOSIS — B370 Candidal stomatitis: Secondary | ICD-10-CM | POA: Diagnosis not present

## 2021-05-25 DIAGNOSIS — N400 Enlarged prostate without lower urinary tract symptoms: Secondary | ICD-10-CM | POA: Diagnosis not present

## 2021-05-25 DIAGNOSIS — J449 Chronic obstructive pulmonary disease, unspecified: Secondary | ICD-10-CM | POA: Diagnosis not present

## 2021-06-04 DIAGNOSIS — J449 Chronic obstructive pulmonary disease, unspecified: Secondary | ICD-10-CM | POA: Diagnosis not present

## 2021-06-04 DIAGNOSIS — J479 Bronchiectasis, uncomplicated: Secondary | ICD-10-CM | POA: Diagnosis not present

## 2021-06-15 DIAGNOSIS — J449 Chronic obstructive pulmonary disease, unspecified: Secondary | ICD-10-CM | POA: Diagnosis not present

## 2021-06-15 DIAGNOSIS — J9611 Chronic respiratory failure with hypoxia: Secondary | ICD-10-CM | POA: Diagnosis not present

## 2021-06-15 DIAGNOSIS — R918 Other nonspecific abnormal finding of lung field: Secondary | ICD-10-CM | POA: Diagnosis not present

## 2021-06-15 DIAGNOSIS — J479 Bronchiectasis, uncomplicated: Secondary | ICD-10-CM | POA: Diagnosis not present

## 2021-07-08 DEATH — deceased

## 2023-01-28 IMAGING — DX DG CHEST 1V PORT
2 series · 2 of 2 positions shown · non-contrast
Comparison: 12/25/2014

CLINICAL DATA: Cough and short of breath

EXAM:
PORTABLE CHEST 1 VIEW

[chest ap (1 of 2)]
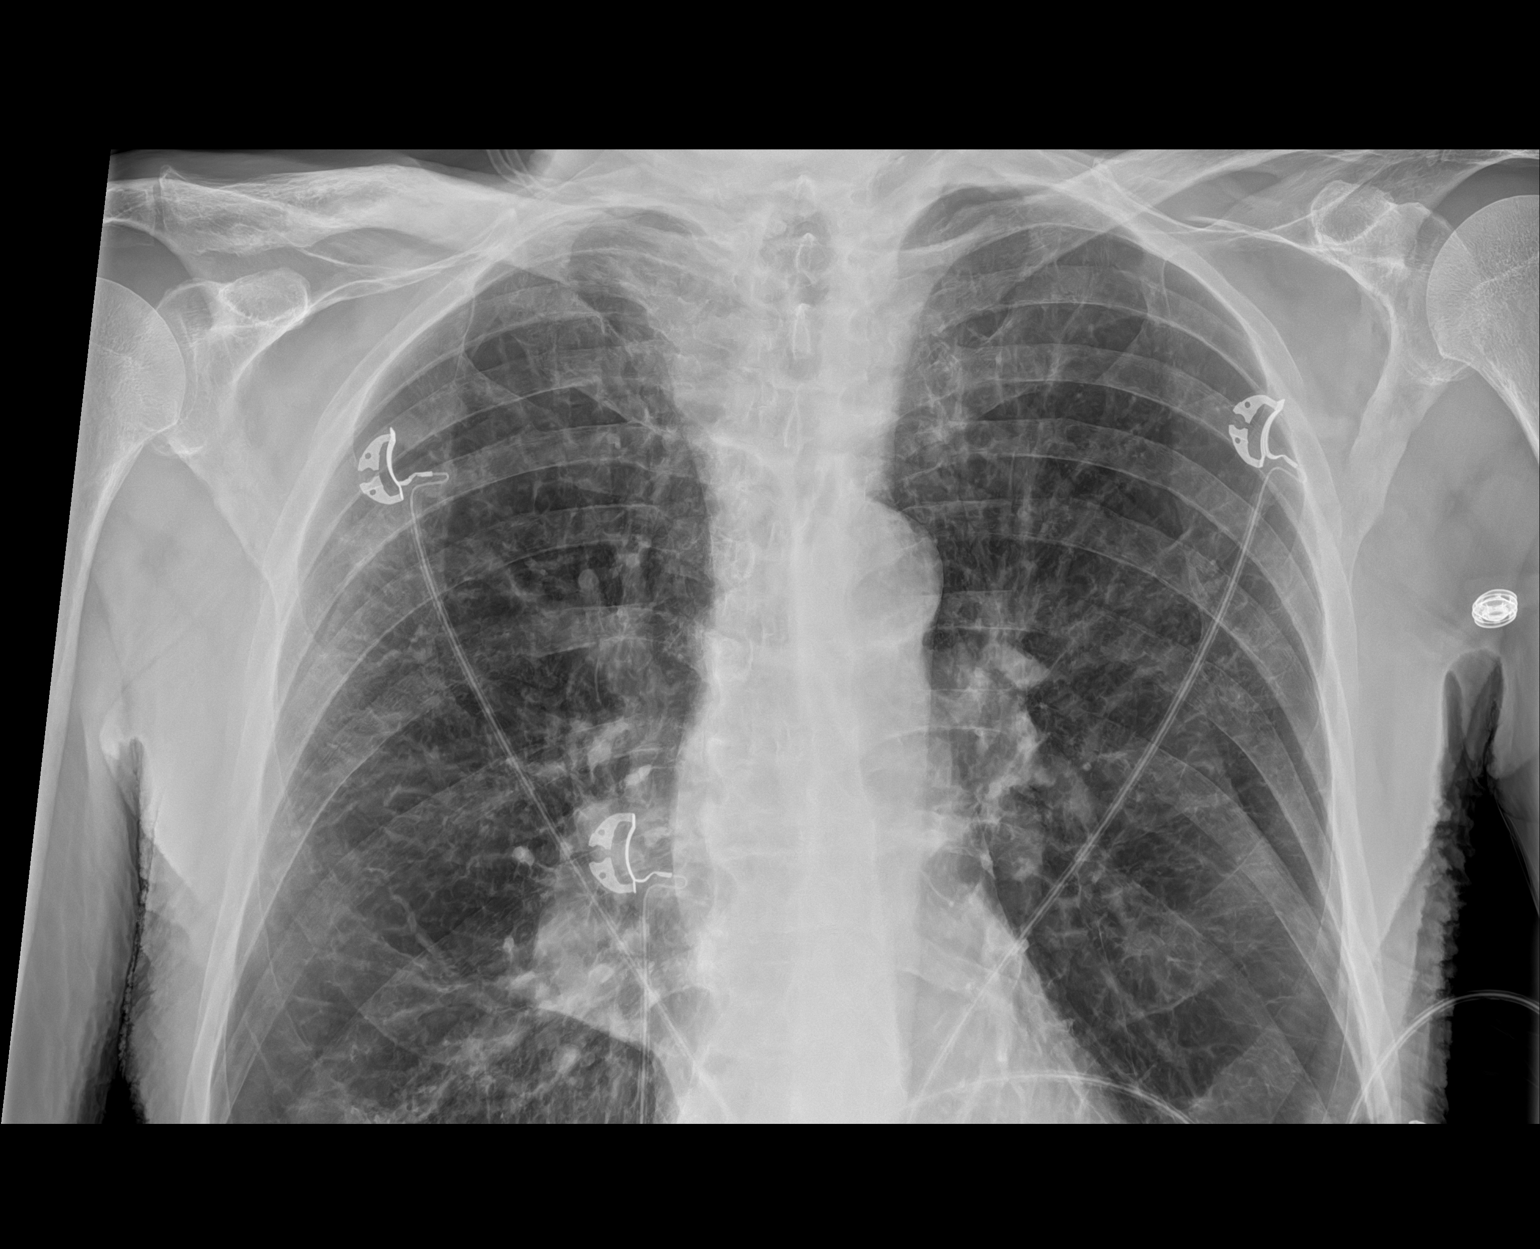

[chest ap (2 of 2)]
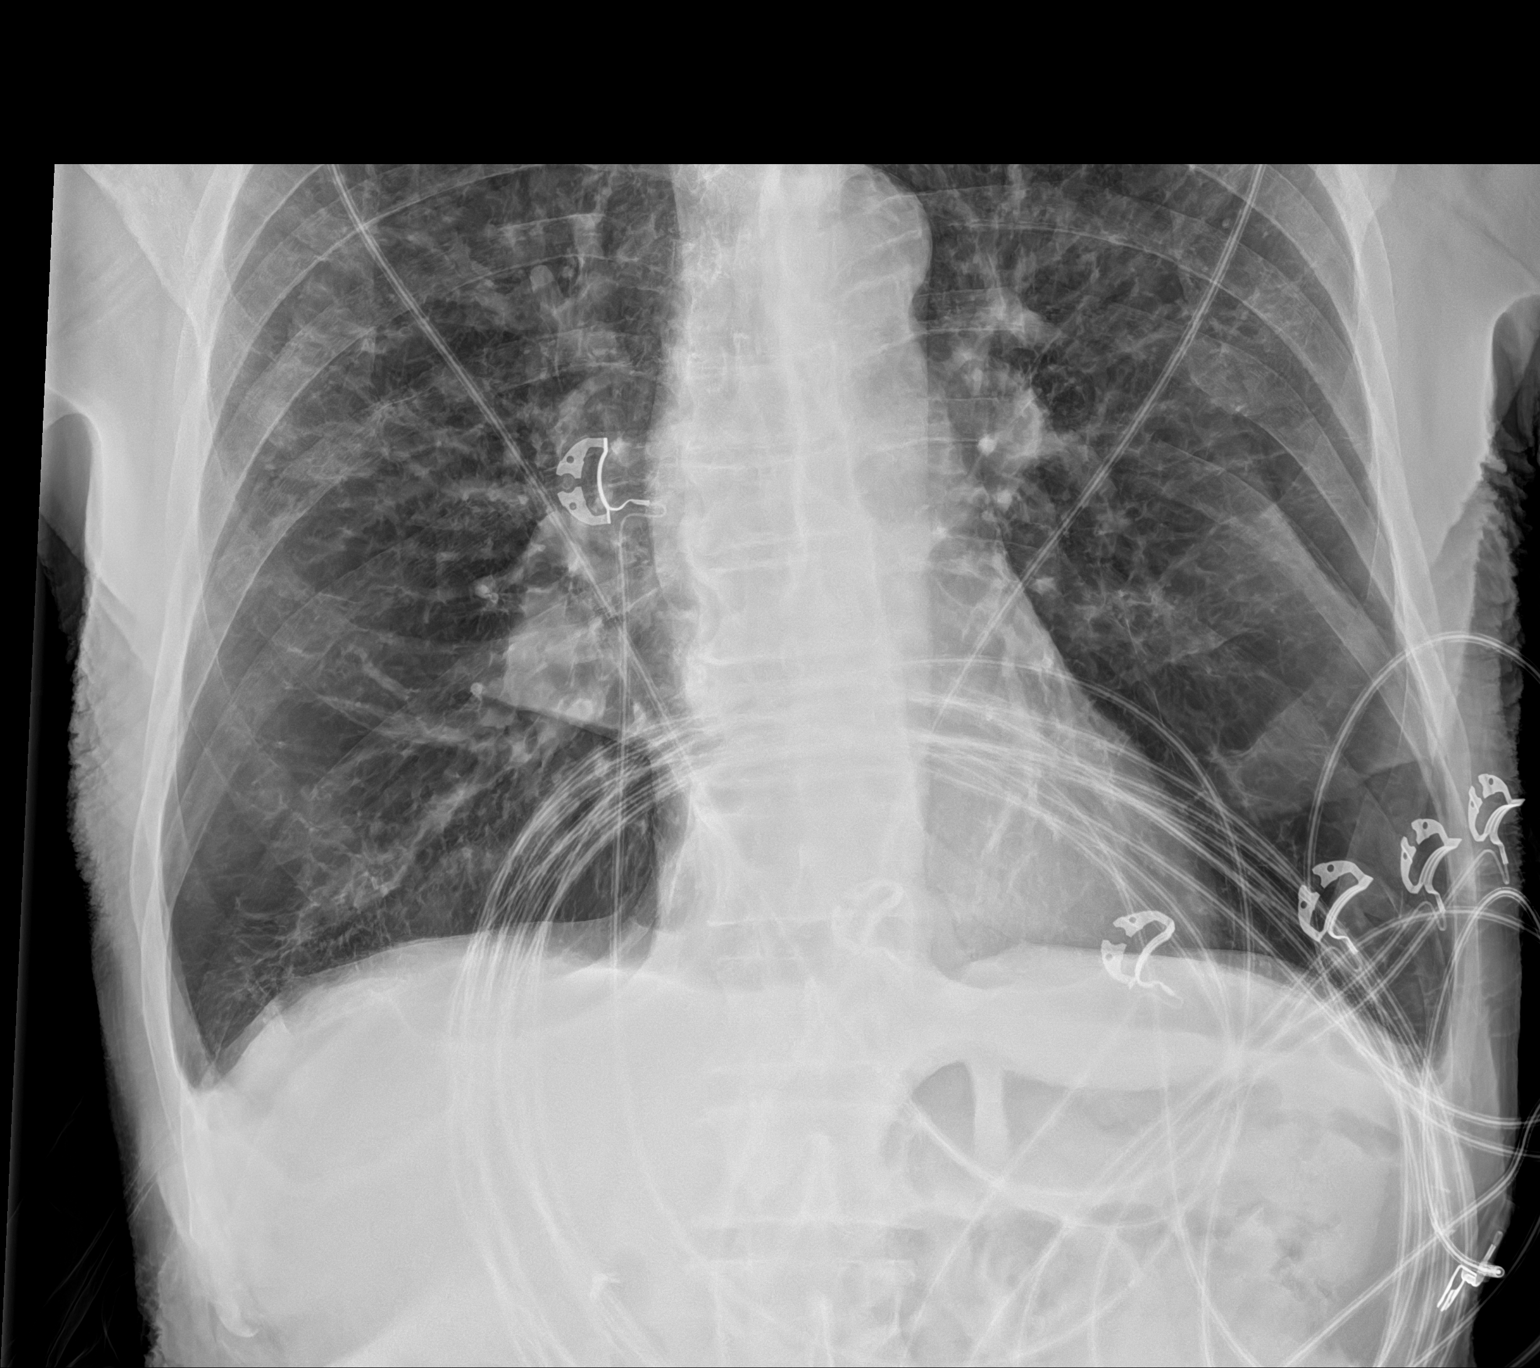

[2 of 2 positions shown; findings below may reference images not displayed]

FINDINGS: Heart size and vascularity normal.  Negative for heart failure.

COPD with pulmonary hyperinflation

Abnormal density right inferior hilar region. This may be within the
right middle lobe or right lower lobe. Possible atelectasis or mass
lesion. No pleural effusion.
IMPRESSION: Right infrahilar density which could be a mass lesion. Recommend
follow-up CT chest with contrast

COPD
